# Patient Record
Sex: Female | Born: 1990 | Race: Black or African American | Hispanic: No | Marital: Single | Smoking: Former smoker
Health system: Southern US, Community
[De-identification: ages and names within clinical notes are randomized; demographics above are authoritative.]

## PROBLEM LIST (undated history)

## (undated) DIAGNOSIS — J40 Bronchitis, not specified as acute or chronic: Secondary | ICD-10-CM

## (undated) DIAGNOSIS — D649 Anemia, unspecified: Secondary | ICD-10-CM

## (undated) DIAGNOSIS — E119 Type 2 diabetes mellitus without complications: Secondary | ICD-10-CM

## (undated) HISTORY — PX: WISDOM TOOTH EXTRACTION: SHX21

---

## 2010-04-26 ENCOUNTER — Inpatient Hospital Stay (HOSPITAL_COMMUNITY): Admission: AD | Admit: 2010-04-26 | Discharge: 2010-04-26 | Payer: Self-pay | Admitting: Obstetrics & Gynecology

## 2010-04-29 ENCOUNTER — Inpatient Hospital Stay (HOSPITAL_COMMUNITY): Admission: AD | Admit: 2010-04-29 | Discharge: 2010-04-29 | Payer: Self-pay | Admitting: Obstetrics and Gynecology

## 2010-04-29 ENCOUNTER — Inpatient Hospital Stay (HOSPITAL_COMMUNITY): Admission: AD | Admit: 2010-04-29 | Discharge: 2010-04-30 | Payer: Self-pay | Admitting: Obstetrics and Gynecology

## 2010-04-30 ENCOUNTER — Inpatient Hospital Stay (HOSPITAL_COMMUNITY): Admission: AD | Admit: 2010-04-30 | Discharge: 2010-05-03 | Payer: Self-pay | Admitting: Obstetrics and Gynecology

## 2010-11-21 LAB — CBC
HCT: 28.9 % — ABNORMAL LOW (ref 36.0–46.0)
HCT: 34.3 % — ABNORMAL LOW (ref 36.0–46.0)
Hemoglobin: 11.9 g/dL — ABNORMAL LOW (ref 12.0–15.0)
Hemoglobin: 9.8 g/dL — ABNORMAL LOW (ref 12.0–15.0)
MCV: 86.3 fL (ref 78.0–100.0)
RBC: 3.35 MIL/uL — ABNORMAL LOW (ref 3.87–5.11)
WBC: 11.7 10*3/uL — ABNORMAL HIGH (ref 4.0–10.5)
WBC: 14.8 10*3/uL — ABNORMAL HIGH (ref 4.0–10.5)

## 2010-11-21 LAB — RH IMMUNE GLOB WKUP(>/=20WKS)(NOT WOMEN'S HOSP)

## 2011-03-31 ENCOUNTER — Emergency Department (HOSPITAL_BASED_OUTPATIENT_CLINIC_OR_DEPARTMENT_OTHER)
Admission: EM | Admit: 2011-03-31 | Discharge: 2011-03-31 | Disposition: A | Discharge status: Home / Self Care | Payer: Self-pay | Attending: Emergency Medicine | Admitting: Emergency Medicine

## 2011-03-31 ENCOUNTER — Emergency Department (INDEPENDENT_AMBULATORY_CARE_PROVIDER_SITE_OTHER): Payer: Self-pay

## 2011-03-31 ENCOUNTER — Encounter: Payer: Self-pay | Admitting: Emergency Medicine

## 2011-03-31 DIAGNOSIS — W2209XA Striking against other stationary object, initial encounter: Secondary | ICD-10-CM | POA: Insufficient documentation

## 2011-03-31 DIAGNOSIS — R609 Edema, unspecified: Secondary | ICD-10-CM

## 2011-03-31 DIAGNOSIS — S93409A Sprain of unspecified ligament of unspecified ankle, initial encounter: Secondary | ICD-10-CM | POA: Insufficient documentation

## 2011-03-31 DIAGNOSIS — M25579 Pain in unspecified ankle and joints of unspecified foot: Secondary | ICD-10-CM

## 2011-03-31 DIAGNOSIS — S93402A Sprain of unspecified ligament of left ankle, initial encounter: Secondary | ICD-10-CM

## 2011-03-31 MED ORDER — HYDROCODONE-ACETAMINOPHEN 5-500 MG PO TABS: 1.0000 | ORAL_TABLET | Freq: Four times a day (QID) | ORAL | Status: AC | PRN

## 2011-03-31 NOTE — ED Notes (Signed)
Pt c/o left ankle pain x 1 week after injuring ankle while wrestling with friend.

## 2011-03-31 NOTE — ED Provider Notes (Signed)
History     Chief Complaint  Patient presents with  . Ankle Pain   Patient is a 20 y.o. female presenting with ankle pain. The history is provided by the patient.  Ankle Pain  Incident onset: One week ago. The incident occurred at home. The injury mechanism was a direct blow (Hyperextension of left ankle). The pain is present in the left ankle. The quality of the pain is described as aching. The pain is moderate. The pain has been constant since onset. Pertinent negatives include no inability to bear weight, no muscle weakness and no loss of sensation. She has tried NSAIDs for the symptoms. The treatment provided mild relief.    History reviewed. No pertinent past medical history.  History reviewed. No pertinent past surgical history.  No family history on file.  History  Substance Use Topics  . Smoking status: Never Smoker   . Smokeless tobacco: Not on file  . Alcohol Use: No    OB History    Grav Para Term Preterm Abortions TAB SAB Ect Mult Living                  Review of Systems  All other systems reviewed and are negative.    Physical Exam  BP 130/68  Pulse 82  Temp(Src) 98 F (36.7 C) (Oral)  Resp 18  SpO2 100%  Physical Exam  Constitutional: She is oriented to person, place, and time. She appears well-developed and well-nourished.  HENT:  Head: Normocephalic.  Eyes: EOM are normal.  Neck: Normal range of motion.  Pulmonary/Chest: Effort normal.  Musculoskeletal:       Left ankle with tenderness at the medial and lateral malleolus. There is mild swelling to the bilateral malleolus. Left foot is neurovascularly intact. There is no bruising erythema or deformity noted. Normal perfusion of toes  Neurological: She is alert and oriented to person, place, and time.  Psychiatric: She has a normal mood and affect.    ED Course  Procedures  MDM Suspect left ankle sprain. Given tenderness Will x-ray to further evaluate for possible fracture. Patient does not  require pain medicine at this time  5:40 AM I personally evaluated the left ankle x-ray I don't see evidence of fracture or dislocation. Home with crutches and an ASO brace. Orthopedic followup     Lyanne Co, MD 03/31/11 [phone]

## 2011-07-03 ENCOUNTER — Emergency Department (HOSPITAL_BASED_OUTPATIENT_CLINIC_OR_DEPARTMENT_OTHER)
Admission: EM | Admit: 2011-07-03 | Discharge: 2011-07-03 | Disposition: A | Discharge status: Home / Self Care | Payer: Self-pay | Attending: Emergency Medicine | Admitting: Emergency Medicine

## 2011-07-03 ENCOUNTER — Encounter (HOSPITAL_BASED_OUTPATIENT_CLINIC_OR_DEPARTMENT_OTHER): Payer: Self-pay | Admitting: *Deleted

## 2011-07-03 ENCOUNTER — Emergency Department (INDEPENDENT_AMBULATORY_CARE_PROVIDER_SITE_OTHER): Payer: Self-pay

## 2011-07-03 DIAGNOSIS — R059 Cough, unspecified: Secondary | ICD-10-CM | POA: Insufficient documentation

## 2011-07-03 DIAGNOSIS — J3489 Other specified disorders of nose and nasal sinuses: Secondary | ICD-10-CM | POA: Insufficient documentation

## 2011-07-03 DIAGNOSIS — R05 Cough: Secondary | ICD-10-CM

## 2011-07-03 DIAGNOSIS — J4489 Other specified chronic obstructive pulmonary disease: Secondary | ICD-10-CM | POA: Insufficient documentation

## 2011-07-03 DIAGNOSIS — J40 Bronchitis, not specified as acute or chronic: Secondary | ICD-10-CM | POA: Insufficient documentation

## 2011-07-03 DIAGNOSIS — J449 Chronic obstructive pulmonary disease, unspecified: Secondary | ICD-10-CM | POA: Insufficient documentation

## 2011-07-03 DIAGNOSIS — J029 Acute pharyngitis, unspecified: Secondary | ICD-10-CM | POA: Insufficient documentation

## 2011-07-03 MED ORDER — ALBUTEROL SULFATE HFA 108 (90 BASE) MCG/ACT IN AERS: 1.0000 | INHALATION_SPRAY | Freq: Four times a day (QID) | RESPIRATORY_TRACT | Status: DC | PRN

## 2011-07-03 MED ORDER — AZITHROMYCIN 250 MG PO TABS: 250.0000 mg | ORAL_TABLET | Freq: Every day | ORAL | Status: AC

## 2011-07-03 MED ORDER — ALBUTEROL SULFATE (5 MG/ML) 0.5% IN NEBU
2.5000 mg | INHALATION_SOLUTION | Freq: Once | RESPIRATORY_TRACT | Status: AC
  Administered 2011-07-03: 2.5 mg via RESPIRATORY_TRACT
  Filled 2011-07-03: qty 0.5

## 2011-07-03 MED ORDER — HYDROCOD POLST-CHLORPHEN POLST 10-8 MG/5ML PO LQCR: 5.0000 mL | Freq: Two times a day (BID) | ORAL | Status: DC

## 2011-07-03 MED ORDER — IPRATROPIUM BROMIDE 0.02 % IN SOLN
0.5000 mg | Freq: Once | RESPIRATORY_TRACT | Status: AC
  Administered 2011-07-03: 0.5 mg via RESPIRATORY_TRACT
  Filled 2011-07-03: qty 2.5

## 2011-07-03 NOTE — ED Provider Notes (Signed)
Medical screening examination/treatment/procedure(s) were performed by non-physician practitioner and as supervising physician I was immediately available for consultation/collaboration.  Doug Sou, MD 07/03/11 [phone]

## 2011-07-03 NOTE — ED Notes (Signed)
2 day history of cough congestion scratchy throat states got much worse this morning with almost continual coughing and sore throat

## 2011-07-03 NOTE — ED Provider Notes (Signed)
History     CSN: [id number] Arrival date & time: 07/03/2011 11:38 AM   First MD Initiated Contact with Patient 07/03/11 1204      Chief Complaint  Patient presents with  . Cough  . Nasal Congestion  . Sore Throat    (Consider location/radiation/quality/duration/timing/severity/associated sxs/prior treatment) Patient is a 20 y.o. female presenting with cough and pharyngitis. The history is provided by the patient. No language interpreter was used.  Cough This is a new problem. The current episode started 2 days ago. The problem occurs constantly. The problem has been gradually worsening. The cough is productive of brown sputum. The maximum temperature recorded prior to her arrival was 100 to 100.9 F. The fever has been present for less than 1 day. Associated symptoms include chest pain. She has tried nothing for the symptoms. The treatment provided no relief. She is a smoker. Her past medical history is significant for COPD and asthma. Her past medical history does not include pneumonia.  Sore Throat Associated symptoms include chest pain and coughing.    History reviewed. No pertinent past medical history.  History reviewed. No pertinent past surgical history.  History reviewed. No pertinent family history.  History  Substance Use Topics  . Smoking status: Current Some Day Smoker  . Smokeless tobacco: Never Used  . Alcohol Use: Yes    OB History    Grav Para Term Preterm Abortions TAB SAB Ect Mult Living                  Review of Systems  Respiratory: Positive for cough.   Cardiovascular: Positive for chest pain.  All other systems reviewed and are negative.    Allergies  Flagyl and Latex  Home Medications  No current outpatient prescriptions on file.  BP 114/53  Pulse 104  Temp(Src) 99.6 F (37.6 C) (Oral)  Resp 18  Ht 5\' 6"  (1.676 m)  SpO2 99%  Physical Exam  Nursing note and vitals reviewed. Constitutional: She appears well-developed and  well-nourished.  HENT:  Head: Normocephalic.  Eyes: Conjunctivae and EOM are normal. Pupils are equal, round, and reactive to light.  Neck: Normal range of motion. Neck supple.  Cardiovascular: Normal rate.   Pulmonary/Chest: She exhibits tenderness.  Abdominal: Soft.  Neurological: She is alert.  Skin: Skin is warm.  Psychiatric: She has a normal mood and affect.    ED Course  Procedures (including critical care time)  Labs Reviewed - No data to display Dg Chest 2 View  07/03/2011  *RADIOLOGY REPORT*  Clinical Data: Cough.  CHEST - 2 VIEW  Comparison: None.  Findings: Lungs are clear.  Heart size is normal.  No pneumothorax or pleural effusion.  IMPRESSION: Negative chest.  Original Report Authenticated By: Bernadene Bell. Maricela Curet, M.D.     No diagnosis found.    MDM  Chest xray no pneumonia.  I will treat with albuterol inhaler,  z pack and tussionex.  Pt advised 3 day recheck with her MD        Brandy Garza, [place] 07/03/11 1305

## 2011-11-10 ENCOUNTER — Emergency Department (HOSPITAL_BASED_OUTPATIENT_CLINIC_OR_DEPARTMENT_OTHER)
Admission: EM | Admit: 2011-11-10 | Discharge: 2011-11-10 | Disposition: A | Discharge status: Home / Self Care | Payer: Medicaid Other | Attending: Emergency Medicine | Admitting: Emergency Medicine

## 2011-11-10 ENCOUNTER — Encounter (HOSPITAL_BASED_OUTPATIENT_CLINIC_OR_DEPARTMENT_OTHER): Payer: Self-pay | Admitting: *Deleted

## 2011-11-10 DIAGNOSIS — R059 Cough, unspecified: Secondary | ICD-10-CM | POA: Insufficient documentation

## 2011-11-10 DIAGNOSIS — R05 Cough: Secondary | ICD-10-CM | POA: Insufficient documentation

## 2011-11-10 DIAGNOSIS — Z87891 Personal history of nicotine dependence: Secondary | ICD-10-CM | POA: Insufficient documentation

## 2011-11-10 DIAGNOSIS — R112 Nausea with vomiting, unspecified: Secondary | ICD-10-CM | POA: Insufficient documentation

## 2011-11-10 DIAGNOSIS — J3489 Other specified disorders of nose and nasal sinuses: Secondary | ICD-10-CM | POA: Insufficient documentation

## 2011-11-10 DIAGNOSIS — R509 Fever, unspecified: Secondary | ICD-10-CM | POA: Insufficient documentation

## 2011-11-10 DIAGNOSIS — R1110 Vomiting, unspecified: Secondary | ICD-10-CM

## 2011-11-10 DIAGNOSIS — B349 Viral infection, unspecified: Secondary | ICD-10-CM

## 2011-11-10 DIAGNOSIS — R197 Diarrhea, unspecified: Secondary | ICD-10-CM | POA: Insufficient documentation

## 2011-11-10 DIAGNOSIS — B9789 Other viral agents as the cause of diseases classified elsewhere: Secondary | ICD-10-CM | POA: Insufficient documentation

## 2011-11-10 LAB — URINALYSIS, ROUTINE W REFLEX MICROSCOPIC
Glucose, UA: NEGATIVE mg/dL
Ketones, ur: 15 mg/dL — AB
Leukocytes, UA: NEGATIVE
Nitrite: NEGATIVE
Protein, ur: NEGATIVE mg/dL

## 2011-11-10 LAB — BASIC METABOLIC PANEL
BUN: 9 mg/dL (ref 6–23)
Chloride: 100 mEq/L (ref 96–112)
Creatinine, Ser: 0.8 mg/dL (ref 0.50–1.10)
GFR calc Af Amer: >90 mL/min (ref >90)

## 2011-11-10 MED ORDER — ONDANSETRON HCL 4 MG/2ML IJ SOLN
4.0000 mg | Freq: Once | INTRAMUSCULAR | Status: AC
  Administered 2011-11-10: 4 mg via INTRAVENOUS
  Filled 2011-11-10: qty 2

## 2011-11-10 MED ORDER — ONDANSETRON 4 MG PO TBDP: 4.0000 mg | ORAL_TABLET | Freq: Three times a day (TID) | ORAL | Status: AC | PRN

## 2011-11-10 MED ORDER — SODIUM CHLORIDE 0.9 % IV BOLUS (SEPSIS)
1000.0000 mL | Freq: Once | INTRAVENOUS | Status: AC
  Administered 2011-11-10: 1000 mL via INTRAVENOUS

## 2011-11-10 MED ORDER — HYDROCOD POLST-CHLORPHEN POLST 10-8 MG/5ML PO LQCR: 5.0000 mL | Freq: Two times a day (BID) | ORAL | Status: DC | PRN

## 2011-11-10 NOTE — Discharge Instructions (Signed)
Antibiotic Nonuse  Your caregiver felt that the infection or problem was not one that would be helped with an antibiotic. Infections may be caused by viruses or bacteria. Only a caregiver can tell which one of these is the likely cause of an illness. A cold is the most common cause of infection in both adults and children. A cold is a virus. Antibiotic treatment will have no effect on a viral infection. Viruses can lead to many lost days of work caring for sick children and many missed days of school. Children may catch as many as 10 "colds" or "flus" per year during which they can be tearful, cranky, and uncomfortable. The goal of treating a virus is aimed at keeping the ill person comfortable. Antibiotics are medications used to help the body fight bacterial infections. There are relatively few types of bacteria that cause infections but there are hundreds of viruses. While both viruses and bacteria cause infection they are very different types of germs. A viral infection will typically go away by itself within 7 to 10 days. Bacterial infections may spread or get worse without antibiotic treatment. Examples of bacterial infections are:  Sore throats (like strep throat or tonsillitis).   Infection in the lung (pneumonia).   Ear and skin infections.  Examples of viral infections are:  Colds or flus.   Most coughs and bronchitis.   Sore throats not caused by Strep.   Runny noses.  It is often best not to take an antibiotic when a viral infection is the cause of the problem. Antibiotics can kill off the helpful bacteria that we have inside our body and allow harmful bacteria to start growing. Antibiotics can cause side effects such as allergies, nausea, and diarrhea without helping to improve the symptoms of the viral infection. Additionally, repeated uses of antibiotics can cause bacteria inside of our body to become resistant. That resistance can be passed onto harmful bacterial. The next time  you have an infection it may be harder to treat if antibiotics are used when they are not needed. Not treating with antibiotics allows our own immune system to develop and take care of infections more efficiently. Also, antibiotics will work better for us when they are prescribed for bacterial infections. Treatments for a child that is ill may include:  Give extra fluids throughout the day to stay hydrated.   Get plenty of rest.   Only give your child over-the-counter or prescription medicines for pain, discomfort, or fever as directed by your caregiver.   The use of a cool mist humidifier may help stuffy noses.   Cold medications if suggested by your caregiver.  Your caregiver may decide to start you on an antibiotic if:  The problem you were seen for today continues for a longer length of time than expected.   You develop a secondary bacterial infection.  SEEK MEDICAL CARE IF:  Fever lasts longer than 5 days.   Symptoms continue to get worse after 5 to 7 days or become severe.   Difficulty in breathing develops.   Signs of dehydration develop (poor drinking, rare urinating, dark colored urine).   Changes in behavior or worsening tiredness (listlessness or lethargy).  Document Released: 11/02/2001 Document Revised: 08/13/2011 Document Reviewed: 05/01/2009 Exit[employer] Patient Information 2012 [employer]. 

## 2011-11-10 NOTE — ED Notes (Signed)
Pt c/o n/v/d x 3days.  

## 2011-11-10 NOTE — ED Provider Notes (Signed)
History     CSN: [id number]  Arrival date & time 11/10/11  1707   First MD Initiated Contact with Patient 11/10/11 1720      Chief Complaint  Patient presents with  . Fever  . Influenza    (Consider location/radiation/quality/duration/timing/severity/associated sxs/prior treatment) Patient is a 21 y.o. female presenting with fever. The history is provided by the patient. No language interpreter was used.  Fever Primary symptoms of the febrile illness include fever, cough, nausea, vomiting and diarrhea. Primary symptoms do not include shortness of breath. The current episode started 3 to 5 days ago. This is a new problem. The problem has not changed since onset.   History reviewed. No pertinent past medical history.  Past Surgical History  Procedure Date  . Cesarean section     History reviewed. No pertinent family history.  History  Substance Use Topics  . Smoking status: Former Games developer  . Smokeless tobacco: Never Used  . Alcohol Use: Yes    OB History    Grav Para Term Preterm Abortions TAB SAB Ect Mult Living                  Review of Systems  Constitutional: Positive for fever.  Respiratory: Positive for cough. Negative for shortness of breath.   Gastrointestinal: Positive for nausea, vomiting and diarrhea.  All other systems reviewed and are negative.    Allergies  Latex and Flagyl  Home Medications   Current Outpatient Rx  Name Route Sig Dispense Refill  . IBUPROFEN 200 MG PO TABS Oral Take 600 mg by mouth once as needed. For fever    . LEVONORGESTREL 20 MCG/24HR IU IUD Intrauterine 1 each by Intrauterine route once. Inserted 2011      BP 118/66  Pulse 98  Temp 99.9 F (37.7 C)  Resp 16  Ht 5\' 6"  (1.676 m)  Wt 170 lb (77.111 kg)  BMI 27.44 kg/m2  SpO2 98%  Physical Exam  Nursing note and vitals reviewed. Constitutional: She is oriented to person, place, and time. She appears well-developed and well-nourished.  HENT:  Head: Normocephalic  and atraumatic.  Right Ear: External ear normal.  Left Ear: External ear normal.  Nose: Rhinorrhea present.  Mouth/Throat: Oropharynx is clear and moist.  Eyes: Conjunctivae and EOM are normal.  Neck: Neck supple.  Cardiovascular: Normal rate and regular rhythm.   Pulmonary/Chest: Effort normal and breath sounds normal.  Abdominal: Soft. Bowel sounds are normal. There is no tenderness.  Musculoskeletal: Normal range of motion.  Neurological: She is alert and oriented to person, place, and time.  Skin: Skin is warm and dry.  Psychiatric: She has a normal mood and affect.    ED Course  Procedures (including critical care time)  Labs Reviewed  URINALYSIS, ROUTINE W REFLEX MICROSCOPIC - Abnormal; Notable for the following:    Ketones, ur 15 (*)    All other components within normal limits  BASIC METABOLIC PANEL - Abnormal; Notable for the following:    Potassium 3.4 (*)    All other components within normal limits  PREGNANCY, URINE   No results found.   1. Vomiting and diarrhea   2. Viral illness       MDM  Pt is tolerating po:pt is feeling better at this time        Brandy Lower, NP 11/10/11 1932

## 2011-11-10 NOTE — ED Notes (Signed)
Report received from [facility] . Assuming care of patient at this time.

## 2011-11-11 NOTE — ED Provider Notes (Signed)
Medical screening examination/treatment/procedure(s) were performed by non-physician practitioner and as supervising physician I was immediately available for consultation/collaboration.   Dahlia Nifong, MD 11/11/11 0821 

## 2012-05-26 ENCOUNTER — Emergency Department (HOSPITAL_BASED_OUTPATIENT_CLINIC_OR_DEPARTMENT_OTHER)
Admission: EM | Admit: 2012-05-26 | Discharge: 2012-05-26 | Disposition: A | Discharge status: Home / Self Care | Payer: Medicaid Other | Attending: Emergency Medicine | Admitting: Emergency Medicine

## 2012-05-26 ENCOUNTER — Emergency Department (HOSPITAL_BASED_OUTPATIENT_CLINIC_OR_DEPARTMENT_OTHER): Payer: Medicaid Other

## 2012-05-26 ENCOUNTER — Encounter (HOSPITAL_BASED_OUTPATIENT_CLINIC_OR_DEPARTMENT_OTHER): Payer: Self-pay

## 2012-05-26 DIAGNOSIS — J069 Acute upper respiratory infection, unspecified: Secondary | ICD-10-CM | POA: Insufficient documentation

## 2012-05-26 DIAGNOSIS — Z87891 Personal history of nicotine dependence: Secondary | ICD-10-CM | POA: Insufficient documentation

## 2012-05-26 HISTORY — DX: Bronchitis, not specified as acute or chronic: J40

## 2012-05-26 MED ORDER — HYDROCOD POLST-CHLORPHEN POLST 10-8 MG/5ML PO LQCR: 5.0000 mL | Freq: Two times a day (BID) | ORAL | Status: DC | PRN

## 2012-05-26 NOTE — ED Provider Notes (Signed)
History     CSN: [id number]  Arrival date & time 05/26/12  1141   First MD Initiated Contact with Patient 05/26/12 1202      Chief Complaint  Patient presents with  . URI    (Consider location/radiation/quality/duration/timing/severity/associated sxs/prior treatment) HPI  Patient with fever began Saturday, resolved Sunday with no other symptoms except began having scratchy throat.  Yesterday began having cough productive of green sputum with voice change.  Paient with soreness to chest increases with cough.  Mild dyspnea with cough, voice change but throat only slighlty sore.    Past Medical History  Diagnosis Date  . Bronchitis     Past Surgical History  Procedure Date  . Cesarean section     No family history on file.  History  Substance Use Topics  . Smoking status: Former Games developer  . Smokeless tobacco: Never Used  . Alcohol Use: Yes    OB History    Grav Para Term Preterm Abortions TAB SAB Ect Mult Living                  Review of Systems  Constitutional: Positive for fever and chills. Negative for activity change, appetite change and unexpected weight change.  HENT: Negative for sore throat, rhinorrhea, neck pain, neck stiffness and sinus pressure.   Eyes: Negative for visual disturbance.  Respiratory: Positive for cough and shortness of breath.   Cardiovascular: Negative for chest pain and leg swelling.  Gastrointestinal: Negative for vomiting, abdominal pain, diarrhea and blood in stool.  Genitourinary: Negative for dysuria, urgency, frequency, vaginal discharge and difficulty urinating.  Musculoskeletal: Negative for myalgias, arthralgias and gait problem.  Skin: Negative for color change and rash.  Neurological: Positive for headaches. Negative for weakness and light-headedness.  Hematological: Does not bruise/bleed easily.  Psychiatric/Behavioral: Negative for dysphoric mood.    Allergies  Latex and Flagyl  Home Medications   Current Outpatient  Rx  Name Route Sig Dispense Refill  . NAPROXEN SODIUM 220 MG PO TABS Oral Take 220 mg by mouth 2 (two) times daily with a meal.    . HYDROCOD POLST-CPM POLST ER 10-8 MG/5ML PO LQCR Oral Take 5 mLs by mouth every 12 (twelve) hours as needed. 140 mL 0  . IBUPROFEN 200 MG PO TABS Oral Take 600 mg by mouth once as needed. For fever    . LEVONORGESTREL 20 MCG/24HR IU IUD Intrauterine 1 each by Intrauterine route once. Inserted 2011      BP 121/81  Pulse 87  Temp 99.4 F (37.4 C) (Oral)  Resp 16  SpO2 99%  Physical Exam  Nursing note and vitals reviewed. Constitutional: She is oriented to person, place, and time. She appears well-developed and well-nourished.  HENT:  Head: Normocephalic and atraumatic.  Eyes: Conjunctivae normal and EOM are normal. Pupils are equal, round, and reactive to light.  Neck: Normal range of motion. Neck supple.  Cardiovascular: Normal rate, regular rhythm, normal heart sounds and intact distal pulses.   Pulmonary/Chest: Effort normal and breath sounds normal.  Abdominal: Soft. Bowel sounds are normal.  Musculoskeletal: Normal range of motion.  Neurological: She is alert and oriented to person, place, and time.  Skin: Skin is warm and dry.  Psychiatric: She has a normal mood and affect. Thought content normal.    ED Course  Procedures (including critical care time)  Labs Reviewed - No data to display No results found.   No diagnosis found.    MDM  CXR obtained due to history  of fever and productive cough.  No acute infiltrate per radiologist.  Plan conservative treatment with cough meds.      Hilario Quarry, MD 05/26/12 1309

## 2012-05-26 NOTE — ED Notes (Signed)
Fever, fatigue,prod cough-c/o started x 6 days ago

## 2017-10-28 ENCOUNTER — Other Ambulatory Visit: Payer: Self-pay

## 2017-10-28 ENCOUNTER — Emergency Department (HOSPITAL_BASED_OUTPATIENT_CLINIC_OR_DEPARTMENT_OTHER)
Admission: EM | Admit: 2017-10-28 | Discharge: 2017-10-28 | Disposition: A | Discharge status: Home / Self Care | Payer: Managed Care, Other (non HMO) | Attending: Emergency Medicine | Admitting: Emergency Medicine

## 2017-10-28 ENCOUNTER — Emergency Department (HOSPITAL_BASED_OUTPATIENT_CLINIC_OR_DEPARTMENT_OTHER): Payer: Managed Care, Other (non HMO)

## 2017-10-28 ENCOUNTER — Encounter (HOSPITAL_BASED_OUTPATIENT_CLINIC_OR_DEPARTMENT_OTHER): Payer: Self-pay | Admitting: Emergency Medicine

## 2017-10-28 DIAGNOSIS — Z79899 Other long term (current) drug therapy: Secondary | ICD-10-CM | POA: Diagnosis not present

## 2017-10-28 DIAGNOSIS — R0602 Shortness of breath: Secondary | ICD-10-CM

## 2017-10-28 DIAGNOSIS — Z91040 Latex allergy status: Secondary | ICD-10-CM | POA: Insufficient documentation

## 2017-10-28 DIAGNOSIS — Z87891 Personal history of nicotine dependence: Secondary | ICD-10-CM | POA: Insufficient documentation

## 2017-10-28 LAB — CBC
HEMATOCRIT: 37.7 % (ref 36.0–46.0)
HEMOGLOBIN: 12.2 g/dL (ref 12.0–15.0)
MCH: 26.6 pg (ref 26.0–34.0)
MCHC: 32.4 g/dL (ref 30.0–36.0)
MCV: 82.3 fL (ref 78.0–100.0)
Platelets: 302 10*3/uL (ref 150–400)
RBC: 4.58 MIL/uL (ref 3.87–5.11)
RDW: 13.4 % (ref 11.5–15.5)
WBC: 6.5 10*3/uL (ref 4.0–10.5)

## 2017-10-28 LAB — D-DIMER, QUANTITATIVE (NOT AT ARMC): D DIMER QUANT: 0.28 ug{FEU}/mL (ref 0.00–0.50)

## 2017-10-28 LAB — BASIC METABOLIC PANEL
Anion gap: 9 (ref 5–15)
BUN: 7 mg/dL (ref 6–20)
CALCIUM: 9.1 mg/dL (ref 8.9–10.3)
CHLORIDE: 102 mmol/L (ref 101–111)
CO2: 26 mmol/L (ref 22–32)
CREATININE: 0.58 mg/dL (ref 0.44–1.00)
GFR calc non Af Amer: >60 mL/min (ref >60)
Glucose, Bld: 84 mg/dL (ref 65–99)
Potassium: 3.3 mmol/L — ABNORMAL LOW (ref 3.5–5.1)
SODIUM: 137 mmol/L (ref 135–145)

## 2017-10-28 LAB — PREGNANCY, URINE: PREG TEST UR: NEGATIVE

## 2017-10-28 LAB — TROPONIN I: Troponin I: <0.03 ng/mL (ref <0.03)

## 2017-10-28 NOTE — ED Provider Notes (Signed)
[facility] EMERGENCY DEPARTMENT Provider Note   CSN: [id number] Arrival date & time: 10/28/17  1507     History   Chief Complaint Chief Complaint  Patient presents with  . Shortness of Breath    HPI Brandy Garza is a 27 y.o. female.  Patient presents to the emergency department today with complaint of shortness of breath described as not feeling ache she can get enough air.  She states that it feels like she has to yawn in order to fill her lungs.  Earlier today she started having pain in her middle back which then turned into a substernal light pressure kind of sensation.  Patient did not have any lightheadedness or syncope.  No fevers or cough.  She did produce some yellow mucus last night without blood.  No nausea, vomiting, or diarrhea.  She does not have a history of asthma denies any wheezing.  No treatments prior to arrival other than fanning herself. Patient denies risk factors for pulmonary embolism including: unilateral leg swelling, history of DVT/PE/other blood clots, use of exogenous hormones, recent immobilizations, recent surgery, recent travel (>4hr segment), malignancy, hemoptysis.  Patient does report that her father was recently diagnosed with an unprovoked pulmonary embolism and is currently on anticoagulation and is seeing a hematologist for hypercoagulability testing. The onset of this condition was acute. The course is constant. Aggravating factors: none. Alleviating factors: none.   Patient does report nausea and vomiting in the mornings over the past several weeks.       Past Medical History:  Diagnosis Date  . Bronchitis     There are no active problems to display for this patient.   Past Surgical History:  Procedure Laterality Date  . CESAREAN SECTION      OB History    No data available       Home Medications    Prior to Admission medications   Medication Sig Start Date End Date Taking? Authorizing Provider    chlorpheniramine-HYDROcodone (TUSSIONEX PENNKINETIC ER) 10-8 MG/5ML LQCR Take 5 mLs by mouth every 12 (twelve) hours as needed. 11/10/11   Teressa LowerPickering, Vrinda, NP  chlorpheniramine-HYDROcodone (TUSSIONEX PENNKINETIC ER) 10-8 MG/5ML LQCR Take 5 mLs by mouth every 12 (twelve) hours as needed. 05/26/12   Margarita Grizzleay, Danielle, MD  ibuprofen (ADVIL,MOTRIN) 200 MG tablet Take 600 mg by mouth once as needed. For fever    [provider]  levonorgestrel (MIRENA) 20 MCG/24HR IUD 1 each by Intrauterine route once. Inserted 2011    [provider]  naproxen sodium (ANAPROX) 220 MG tablet Take 220 mg by mouth 2 (two) times daily with a meal.    [provider]    Family History History reviewed. No pertinent family history.  Social History Social History   Tobacco Use  . Smoking status: Former Games developermoker  . Smokeless tobacco: Never Used  Substance Use Topics  . Alcohol use: Yes  . Drug use: No     Allergies   Latex and Flagyl [metronidazole hcl]   Review of Systems Review of Systems  Constitutional: Negative for diaphoresis and fever.  Eyes: Negative for redness.  Respiratory: Positive for shortness of breath. Negative for cough.   Cardiovascular: Positive for chest pain. Negative for palpitations and leg swelling.  Gastrointestinal: Positive for nausea (ongoing) and vomiting (ongoing). Negative for abdominal pain.  Genitourinary: Negative for dysuria.  Musculoskeletal: Positive for back pain. Negative for neck pain.  Skin: Negative for rash.  Neurological: Negative for syncope and light-headedness.  Psychiatric/Behavioral: The  patient is not nervous/anxious.      Physical Exam Updated Vital Signs BP 139/87 (BP Location: Left Arm)   Pulse 82   Temp 99.2 F (37.3 C) (Oral)   Resp 19   Ht 5\' 7"  (1.702 m)   Wt 97.5 kg (215 lb)   SpO2 99%   BMI 33.67 kg/m   Physical Exam  Constitutional: She appears well-developed and well-nourished.  HENT:  Head: Normocephalic  and atraumatic.  Eyes: Conjunctivae are normal. Right eye exhibits no discharge. Left eye exhibits no discharge.  Neck: Normal range of motion. Neck supple.  Cardiovascular: Normal rate, regular rhythm and normal heart sounds.  Pulmonary/Chest: Effort normal and breath sounds normal. She has no decreased breath sounds. She has no wheezes. She has no rales.  Abdominal: Soft. There is no tenderness.  Musculoskeletal:       Right lower leg: She exhibits no tenderness and no edema.       Left lower leg: She exhibits no tenderness and no edema.  No clinical signs of DVT.  Neurological: She is alert.  Skin: Skin is warm and dry.  Psychiatric: She has a normal mood and affect.  Nursing note and vitals reviewed.    ED Treatments / Results  Labs (all labs ordered are listed, but only abnormal results are displayed) Labs Reviewed  BASIC METABOLIC PANEL - Abnormal; Notable for the following components:      Result Value   Potassium 3.3 (*)    All other components within normal limits  CBC  D-DIMER, QUANTITATIVE (NOT AT [facility])  PREGNANCY, URINE  TROPONIN I    EKG  EKG Interpretation  Date/Time:  Thursday October 28 2017 16:46:05 EST Ventricular Rate:  85 PR Interval:    QRS Duration: 84 QT Interval:  338 QTC Calculation: 402 R Axis:     Text Interpretation:  Sinus rhythm Nonspecific repol abnormality, inferior leads Borderline ST elevation, lateral leads No old tracing to compare Confirmed by Rolan Bucco [phone]) on 10/28/2017 4:53:48 PM       Radiology Dg Chest 2 View  Result Date: 10/28/2017 CLINICAL DATA:  Trouble getting enough air. Thoracic pain and epigastric pain. EXAM: CHEST  2 VIEW COMPARISON:  05/26/2012 FINDINGS: The heart size and mediastinal contours are within normal limits. Both lungs are clear. The visualized skeletal structures are unremarkable. IMPRESSION: No active cardiopulmonary disease. Electronically Signed   By: Elberta Fortis M.D.   On: 10/28/2017 15:38      Procedures Procedures (including critical care time)  Medications Ordered in ED Medications - No data to display   Initial Impression / Assessment and Plan / ED Course  I have reviewed the triage vital signs and the nursing notes.  Pertinent labs & imaging results that were available during my care of the patient were reviewed by me and considered in my medical decision making (see chart for details).     Patient seen and examined. Work-up initiated. EKG reviewed with Dr. Fredderick Phenix.  Patient is PERC negative however given family history of blood clot, unprovoked, will screen with d-dimer.  Patient agrees.  Vital signs reviewed and are as follows: BP 139/87 (BP Location: Left Arm)   Pulse 82   Temp 99.2 F (37.3 C) (Oral)   Resp 19   Ht 5\' 7"  (1.702 m)   Wt 97.5 kg (215 lb)   SpO2 99%   BMI 33.67 kg/m   5:21 PM Pt low-risk Wells' Criteria (0). D-dimer negative.   6:31 PM remainder of  workup reassuring.  Troponin is negative.  Patient is now back to her baseline without any shortness of breath or chest pressure.  She is comfortable with discharged home.  She is instructed to return if their chest pain gets worse and does not go away with rest, they have an attack of chest pain lasting longer than usual despite rest and treatment with the medications their caregiver has prescribed, if they wake from sleep with chest pain or shortness of breath, if they feel dizzy or faint, if they have chest pain not typical of their usual pain, or if they have any other emergent concerns regarding their health.  The patient verbalized understanding and agreed.    Final Clinical Impressions(s) / ED Diagnoses   Final diagnoses:  SOB (shortness of breath)   Patient with vague shortness of breath symptoms and some mild chest pressure today.  EKG with nonspecific ST changes.  Troponin is negative, d-dimer is negative.  Patient is back at her baseline with normal vital signs, no hypoxia or  tachycardia.  She will monitor symptoms closely at home and return with any worsening.  ED Discharge Orders    None       Renne Crigler, PA-C 10/28/17 1834    Rolan Bucco, MD 10/28/17 [phone]

## 2017-10-28 NOTE — ED Triage Notes (Signed)
Patient states that she woke up last night in the middle of the with feeling like she was not getting enough air, the patient states that "I make myself yawn to get enough air" - Patient then reports that she is having mid thoracic back pain that radiates to her epigastric region

## 2017-10-28 NOTE — Discharge Instructions (Signed)
Please read and follow all provided instructions.  Your diagnoses today include:  1. SOB (shortness of breath)     Tests performed today include:  EKG  Chest x-ray-no pneumonia or other problems  Blood test for heart irritation -negative  Screening test for blood clot -negative  Urine test -no pregnancy  Vital signs. See below for your results today.   Medications prescribed:   None  Take any prescribed medications only as directed.  Home care instructions:  Follow any educational materials contained in this packet.  BE VERY CAREFUL not to take multiple medicines containing Tylenol (also called acetaminophen). Doing so can lead to an overdose which can damage your liver and cause liver failure and possibly death.   Follow-up instructions: Please follow-up with your primary care provider in the next 3 days for further evaluation of your symptoms.   Return instructions:   Please return to the Emergency Department if you experience worsening symptoms.   Return with worsening chest pain, shortness of breath, passing out, vomiting.  Please return if you have any other emergent concerns.  Additional Information:  Your vital signs today were: BP 106/67    Pulse 88    Temp 99.2 F (37.3 C) (Oral)    Resp 14    Ht 5\' 7"  (1.702 m)    Wt 97.5 kg (215 lb)    SpO2 100%    BMI 33.67 kg/m  If your blood pressure (BP) was elevated above 135/85 this visit, please have this repeated by your doctor within one month. --------------

## 2018-04-30 ENCOUNTER — Other Ambulatory Visit: Payer: Self-pay

## 2018-04-30 ENCOUNTER — Emergency Department (HOSPITAL_BASED_OUTPATIENT_CLINIC_OR_DEPARTMENT_OTHER): Payer: Managed Care, Other (non HMO)

## 2018-04-30 ENCOUNTER — Encounter (HOSPITAL_BASED_OUTPATIENT_CLINIC_OR_DEPARTMENT_OTHER): Payer: Self-pay | Admitting: Adult Health

## 2018-04-30 ENCOUNTER — Emergency Department (HOSPITAL_BASED_OUTPATIENT_CLINIC_OR_DEPARTMENT_OTHER)
Admission: EM | Admit: 2018-04-30 | Discharge: 2018-04-30 | Disposition: A | Discharge status: Home / Self Care | Payer: Managed Care, Other (non HMO) | Attending: Emergency Medicine | Admitting: Emergency Medicine

## 2018-04-30 DIAGNOSIS — Z79899 Other long term (current) drug therapy: Secondary | ICD-10-CM | POA: Diagnosis not present

## 2018-04-30 DIAGNOSIS — Y999 Unspecified external cause status: Secondary | ICD-10-CM | POA: Insufficient documentation

## 2018-04-30 DIAGNOSIS — X58XXXA Exposure to other specified factors, initial encounter: Secondary | ICD-10-CM | POA: Insufficient documentation

## 2018-04-30 DIAGNOSIS — Z91040 Latex allergy status: Secondary | ICD-10-CM | POA: Insufficient documentation

## 2018-04-30 DIAGNOSIS — Y939 Activity, unspecified: Secondary | ICD-10-CM | POA: Diagnosis not present

## 2018-04-30 DIAGNOSIS — S299XXA Unspecified injury of thorax, initial encounter: Secondary | ICD-10-CM | POA: Diagnosis present

## 2018-04-30 DIAGNOSIS — S29012A Strain of muscle and tendon of back wall of thorax, initial encounter: Secondary | ICD-10-CM

## 2018-04-30 DIAGNOSIS — Z87891 Personal history of nicotine dependence: Secondary | ICD-10-CM | POA: Diagnosis not present

## 2018-04-30 DIAGNOSIS — Y929 Unspecified place or not applicable: Secondary | ICD-10-CM | POA: Diagnosis not present

## 2018-04-30 MED ORDER — KETOROLAC TROMETHAMINE 60 MG/2ML IM SOLN
30.0000 mg | Freq: Once | INTRAMUSCULAR | Status: AC
  Administered 2018-04-30: 30 mg via INTRAMUSCULAR
  Filled 2018-04-30: qty 2

## 2018-04-30 MED ORDER — CYCLOBENZAPRINE HCL 10 MG PO TABS: 10.0000 mg | ORAL_TABLET | Freq: Three times a day (TID) | ORAL | 0 refills | Status: DC | PRN

## 2018-04-30 NOTE — Discharge Instructions (Addendum)
Do not drive or operate heavy machinery or drink alcohol while on the muscle relaxer Flexeril.  You may take ibuprofen and/or Tylenol for pain as well.  If you develop worsening pain, trouble breathing, fever, weakness or numbness in your arms or legs, incontinence of your bowels or bladder, or any other new/concerning symptoms or return to the ER for evaluation.

## 2018-04-30 NOTE — ED Provider Notes (Signed)
[facility] EMERGENCY DEPARTMENT Provider Note   CSN: [id number] Arrival date & time: 04/30/18  1112     History   Chief Complaint Chief Complaint  Patient presents with  . Neck Pain    HPI Brandy Garza is a 27 y.o. female.   27 year old female presents with right-sided back pain.  2 weeks ago she had similar left-sided back pain the seem to go away after NSAIDs for a couple days.  No trauma noted.  Pain started at her bra line on the back under her scapula.  Any type of movement to the right worsens her pain.  It is somewhat pleuritic but there is no shortness of breath.  There is no cough, chest pain, abdominal pain.  No weakness or numbness.  Moving her right arm worsens the pain.  She took some ibuprofen yesterday but nothing today.  Past Medical History:  Diagnosis Date  . Bronchitis     There are no active problems to display for this patient.   Past Surgical History:  Procedure Laterality Date  . CESAREAN SECTION       OB History   None      Home Medications    Prior to Admission medications   Medication Sig Start Date End Date Taking? Authorizing Provider  chlorpheniramine-HYDROcodone (TUSSIONEX PENNKINETIC ER) 10-8 MG/5ML LQCR Take 5 mLs by mouth every 12 (twelve) hours as needed. 11/10/11   [name], NP  chlorpheniramine-HYDROcodone (TUSSIONEX PENNKINETIC ER) 10-8 MG/5ML LQCR Take 5 mLs by mouth every 12 (twelve) hours as needed. 05/26/12   Margarita Grizzle, MD  cyclobenzaprine (FLEXERIL) 10 MG tablet Take 1 tablet (10 mg total) by mouth 3 (three) times daily as needed for muscle spasms. 04/30/18   Pricilla Loveless, MD  ibuprofen (ADVIL,MOTRIN) 200 MG tablet Take 600 mg by mouth once as needed. For fever    [provider]  levonorgestrel (MIRENA) 20 MCG/24HR IUD 1 each by Intrauterine route once. Inserted 2011    [provider]  naproxen sodium (ANAPROX) 220 MG tablet Take 220 mg by mouth 2 (two) times daily with a meal.     [provider]    Family History History reviewed. No pertinent family history.  Social History Social History   Tobacco Use  . Smoking status: Former Games developer  . Smokeless tobacco: Never Used  Substance Use Topics  . Alcohol use: Yes  . Drug use: No     Allergies   Latex and Flagyl [metronidazole hcl]   Review of Systems Review of Systems  Constitutional: Negative for fever.  Respiratory: Negative for cough and shortness of breath.   Cardiovascular: Negative for chest pain.  Gastrointestinal: Negative for abdominal pain and vomiting.  Genitourinary:       No incontinence  Musculoskeletal: Positive for back pain.  Neurological: Negative for weakness and numbness.  All other systems reviewed and are negative.    Physical Exam Updated Vital Signs BP 137/73 (BP Location: Right Arm)   Pulse 87   Temp 98.4 F (36.9 C) (Oral)   Resp 20   Ht 5\' 7"  (1.702 m)   Wt 112.8 kg   LMP 03/21/2018 (Approximate)   SpO2 100%   BMI 38.95 kg/m   Physical Exam  Constitutional: She is oriented to person, place, and time. She appears well-developed and well-nourished.  obese  HENT:  Head: Normocephalic and atraumatic.  Right Ear: External ear normal.  Left Ear: External ear normal.  Nose: Nose normal.  Eyes: Right eye exhibits  no discharge. Left eye exhibits no discharge.  Cardiovascular: Normal rate, regular rhythm and normal heart sounds.  Pulses:      Radial pulses are 2+ on the right side.  Pulmonary/Chest: Effort normal and breath sounds normal.  Abdominal: Soft. There is no tenderness. There is no CVA tenderness.  Musculoskeletal:       Thoracic back: She exhibits tenderness and bony tenderness.       Back:  Neurological: She is alert and oriented to person, place, and time.  5/5 strength in all 4 extremities. Grossly normal sensation  Skin: Skin is warm and dry.  Nursing note and vitals reviewed.    ED Treatments / Results  Labs (all labs ordered  are listed, but only abnormal results are displayed) Labs Reviewed - No data to display  EKG None  Radiology Dg Chest 2 View  Result Date: 04/30/2018 CLINICAL DATA:  Right back pain in the upper chest. EXAM: CHEST - 2 VIEW COMPARISON:  10/28/2017 FINDINGS: Questionable rib irregularity along the right posterior fifth and sixth rib region, not appreciable on 10/28/2017. The lungs appear clear. Cardiac and mediastinal margins appear normal. No pleural effusion identified. IMPRESSION: 1. Questionable right posterolateral fifth and sixth rib irregularity. Healing rib fractures are not excluded although no displaced rib fracture is identified. This could be further assessed with either dedicated rib views or CT chest if clinically warranted. Electronically Signed   By: Gaylyn RongWalter  Liebkemann M.D.   On: 04/30/2018 12:56    Procedures Procedures (including critical care time)  Medications Ordered in ED Medications  ketorolac (TORADOL) injection 30 mg (30 mg Intramuscular Given 04/30/18 1217)     Initial Impression / Assessment and Plan / ED Course  I have reviewed the triage vital signs and the nursing notes.  Pertinent labs & imaging results that were available during my care of the patient were reviewed by me and considered in my medical decision making (see chart for details).     Patient's pain is almost undoubtedly muscular pain of her thoracic back.  Chest x-ray obtained given the location.  No obvious pneumothorax or other acute lung abnormality.  Possible abnormalities to the posterior lateral ribs.  While this would correlate with her pain, there is been no trauma.  This also appears to be an old injury, and her symptoms started yesterday.  Given all this, I have a low suspicion for an acute rib injury.  I discussed findings with patient and given it would not change management I do not think extra x-rays or CT would be beneficial.  She has had great relief with Toradol and I will have her  continue ibuprofen and prescribed a muscle relaxer.  Discussed return precautions.  As far as other acute intra-thoracic abnormalities, I have very low suspicion for ACS, PE, dissection, or intra-abdominal problems.  Final Clinical Impressions(s) / ED Diagnoses   Final diagnoses:  Strain of thoracic back region    ED Discharge Orders         Ordered    cyclobenzaprine (FLEXERIL) 10 MG tablet  3 times daily PRN     04/30/18 1325           Pricilla LovelessGoldston, Kambrie Eddleman, MD 04/30/18 1450

## 2018-04-30 NOTE — ED Triage Notes (Signed)
Presents with right sided neck pain and right sided shoulder blade pain that began yesterday and at first only hurt with movement, today the pain is worse and hurts all the time. SHe states it hurt her to take a deep breath and she is unable to lift her right arm due to pain.

## 2019-04-09 IMAGING — CR DG CHEST 2V
2 series · 2 of 2 positions shown · non-contrast
Comparison: 05/26/2012

CLINICAL DATA: Trouble getting enough air. Thoracic pain and
epigastric pain.

EXAM:
CHEST  2 VIEW

[w chest pa]
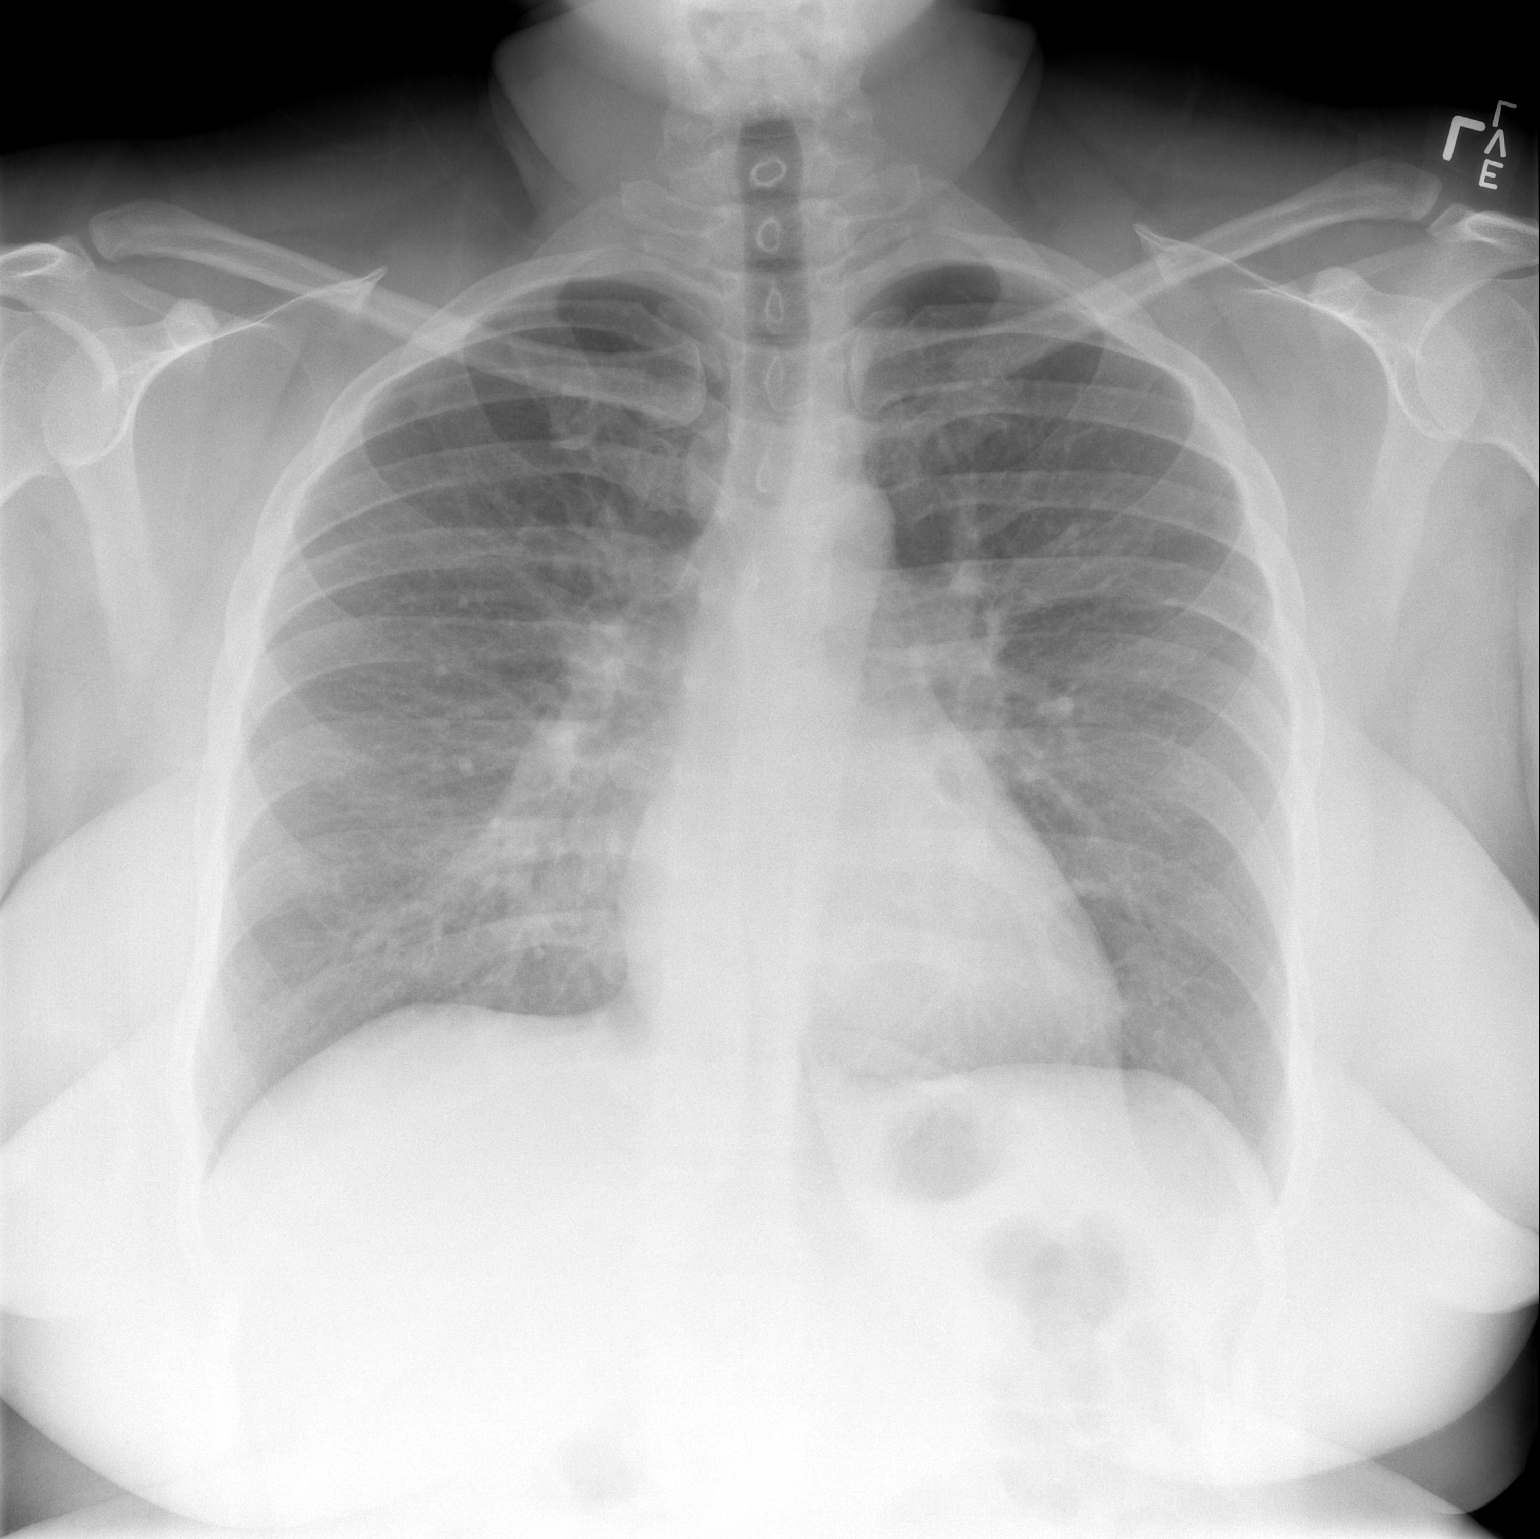

[w chest lat]
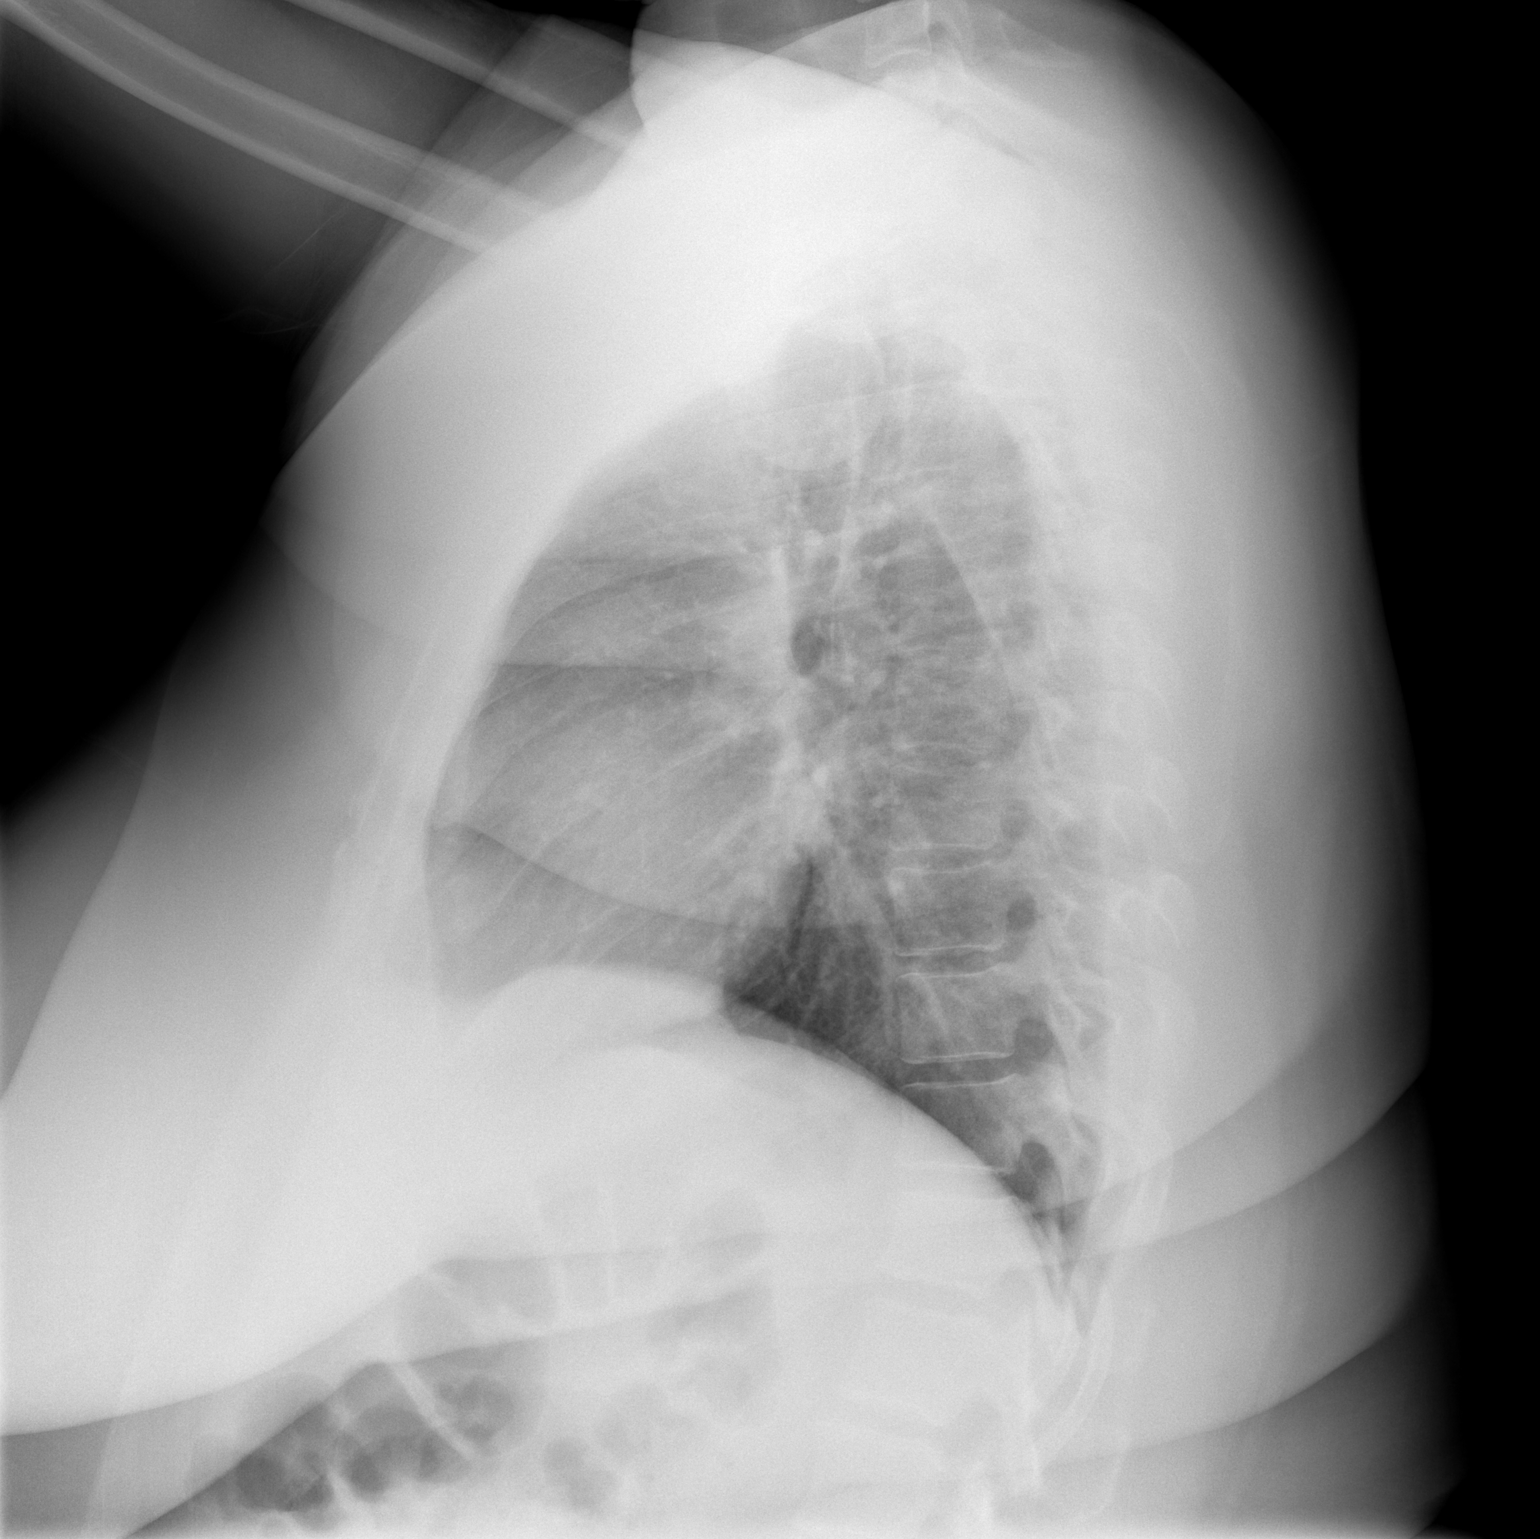

[2 of 2 positions shown; findings below may reference images not displayed]

FINDINGS: The heart size and mediastinal contours are within normal limits.
Both lungs are clear. The visualized skeletal structures are
unremarkable.
IMPRESSION: No active cardiopulmonary disease.

## 2020-12-06 LAB — RESULTS CONSOLE HPV: CHL HPV: NEGATIVE

## 2020-12-06 LAB — HM PAP SMEAR

## 2022-10-07 ENCOUNTER — Encounter: Payer: Self-pay | Admitting: Family Medicine

## 2022-10-07 ENCOUNTER — Telehealth: Payer: Self-pay | Admitting: Family Medicine

## 2022-10-07 ENCOUNTER — Ambulatory Visit (INDEPENDENT_AMBULATORY_CARE_PROVIDER_SITE_OTHER): Payer: Medicaid Other | Admitting: Family Medicine

## 2022-10-07 ENCOUNTER — Other Ambulatory Visit: Payer: Self-pay

## 2022-10-07 VITALS — BP 102/80 | HR 92 | Temp 98.4°F | Resp 16 | Ht 66.0 in | Wt 242.0 lb

## 2022-10-07 DIAGNOSIS — E1165 Type 2 diabetes mellitus with hyperglycemia: Secondary | ICD-10-CM

## 2022-10-07 LAB — LIPID PANEL
Cholesterol: 201 mg/dL — ABNORMAL HIGH (ref 0–200)
HDL: 44.2 mg/dL (ref >39.00)
LDL Cholesterol: 140 mg/dL — ABNORMAL HIGH (ref 0–99)
NonHDL: 157.27
Total CHOL/HDL Ratio: 5
Triglycerides: 88 mg/dL (ref 0.0–149.0)
VLDL: 17.6 mg/dL (ref 0.0–40.0)

## 2022-10-07 LAB — CBC
HCT: 40.5 % (ref 36.0–46.0)
Hemoglobin: 13.2 g/dL (ref 12.0–15.0)
MCHC: 32.6 g/dL (ref 30.0–36.0)
MCV: 80.2 fl (ref 78.0–100.0)
Platelets: 332 10*3/uL (ref 150.0–400.0)
RBC: 5.05 Mil/uL (ref 3.87–5.11)
RDW: 13.5 % (ref 11.5–15.5)
WBC: 4.8 10*3/uL (ref 4.0–10.5)

## 2022-10-07 LAB — COMPREHENSIVE METABOLIC PANEL
ALT: 13 U/L (ref 0–35)
AST: 12 U/L (ref 0–37)
Albumin: 4.1 g/dL (ref 3.5–5.2)
Alkaline Phosphatase: 76 U/L (ref 39–117)
BUN: 10 mg/dL (ref 6–23)
CO2: 28 mEq/L (ref 19–32)
Calcium: 9.4 mg/dL (ref 8.4–10.5)
Chloride: 99 mEq/L (ref 96–112)
Creatinine, Ser: 0.66 mg/dL (ref 0.40–1.20)
GFR: 115.27 mL/min (ref >60.00)
Glucose, Bld: 282 mg/dL — ABNORMAL HIGH (ref 70–99)
Potassium: 4.2 mEq/L (ref 3.5–5.1)
Sodium: 134 mEq/L — ABNORMAL LOW (ref 135–145)
Total Bilirubin: 0.3 mg/dL (ref 0.2–1.2)
Total Protein: 6.6 g/dL (ref 6.0–8.3)

## 2022-10-07 LAB — URINALYSIS, ROUTINE W REFLEX MICROSCOPIC
Bilirubin Urine: NEGATIVE
Hgb urine dipstick: NEGATIVE
Ketones, ur: 15 — AB
Leukocytes,Ua: NEGATIVE
Nitrite: NEGATIVE
RBC / HPF: NONE SEEN (ref 0–?)
Specific Gravity, Urine: >=1.03 — AB (ref 1.000–1.030)
Total Protein, Urine: NEGATIVE
Urine Glucose: >=1000 — AB
Urobilinogen, UA: 0.2 (ref 0.0–1.0)
pH: 6 (ref 5.0–8.0)

## 2022-10-07 LAB — HEMOGLOBIN A1C: Hgb A1c MFr Bld: 16.7 % — ABNORMAL HIGH (ref 4.6–6.5)

## 2022-10-07 LAB — MICROALBUMIN / CREATININE URINE RATIO
Creatinine,U: 129.1 mg/dL
Microalb Creat Ratio: 0.5 mg/g (ref 0.0–30.0)
Microalb, Ur: <0.7 mg/dL (ref 0.0–1.9)

## 2022-10-07 MED ORDER — ACCU-CHEK GUIDE VI STRP: 1.0000 | ORAL_STRIP | 3 refills | Status: DC

## 2022-10-07 MED ORDER — LANTUS SOLOSTAR 100 UNIT/ML ~~LOC~~ SOPN: PEN_INJECTOR | SUBCUTANEOUS | 11 refills | Status: DC

## 2022-10-07 MED ORDER — ACCU-CHEK FASTCLIX LANCETS MISC: 1.0000 | 3 refills | Status: DC

## 2022-10-07 MED ORDER — ACCU-CHEK GUIDE ME W/DEVICE KIT: 1.0000 | PACK | 0 refills | Status: DC

## 2022-10-07 NOTE — Telephone Encounter (Signed)
Pt called and stated that you prescribe the pt with glucose test strips but the pt said she do not have a machine to test it. Please give the pt a call

## 2022-10-07 NOTE — Telephone Encounter (Signed)
Pt said no test strips either. Called again 1:55

## 2022-10-07 NOTE — Telephone Encounter (Signed)
Returned patients call x 2 both times phone hung up. Will call back later.

## 2022-10-07 NOTE — Telephone Encounter (Signed)
Glucose machine, strips and lancets sent to pharmacy. Patient aware

## 2022-10-07 NOTE — Progress Notes (Signed)
Established Patient Office Visit   Subjective:  Patient ID: Brandy Garza, female    DOB: [birth year]  Age: 32 y.o. MRN: [id number]  Chief Complaint  Patient presents with   New Patient (Initial Visit)    Pt went to an urgent care and was told her glucose was elevated.  She was referred her to have her glucose checked.     HPI Encounter Diagnoses  Name Primary?   Type 2 diabetes mellitus with hyperglycemia, without long-term current use of insulin (HCC) Yes   Recent onset of thirst weight loss and increase in urinary frequency.  Was seen at urgent care glucose was elevated and hemoglobin A1c was measured at 15.  She was started on metformin 500 mg twice daily.  She has a 38-month-old at home as well as an older child.  There was gestational diet Beatties with the older child.  She was taking once a day insulin pregnancy.  Her near vision has been affected..  Father has died controlled diabetes.  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   Review of Systems  Constitutional:  Positive for weight loss.  HENT: Negative.    Eyes:  Positive for blurred vision. Negative for discharge and redness.  Respiratory: Negative.    Cardiovascular: Negative.   Gastrointestinal:  Negative for abdominal pain.  Genitourinary:  Positive for frequency.  Musculoskeletal: Negative.  Negative for myalgias.  Skin:  Negative for rash.  Neurological:  Negative for tingling, loss of consciousness and weakness.  Endo/Heme/Allergies:  Negative for polydipsia.     Current Outpatient Medications:    insulin glargine (LANTUS SOLOSTAR) 100 UNIT/ML Solostar Pen, Start with 20 U s.q. nightly., Disp: 15 mL, Rfl: 11   levonorgestrel (MIRENA) 20 MCG/24HR IUD, 1 each by Intrauterine route once. Inserted 2011, Disp: , Rfl:    metFORMIN (GLUCOPHAGE) 500 MG tablet, Take 500 mg by mouth 2 (two) times daily., Disp: , Rfl:    naproxen sodium (ANAPROX) 220 MG tablet, Take 220 mg by mouth 2 (two) times daily with  a meal., Disp: , Rfl:    chlorpheniramine-HYDROcodone (TUSSIONEX PENNKINETIC ER) 10-8 MG/5ML LQCR, Take 5 mLs by mouth every 12 (twelve) hours as needed., Disp: 140 mL, Rfl: 0   chlorpheniramine-HYDROcodone (TUSSIONEX PENNKINETIC ER) 10-8 MG/5ML LQCR, Take 5 mLs by mouth every 12 (twelve) hours as needed. (Patient not taking: Reported on 10/07/2022), Disp: 140 mL, Rfl: 0   cyclobenzaprine (FLEXERIL) 10 MG tablet, Take 1 tablet (10 mg total) by mouth 3 (three) times daily as needed for muscle spasms. (Patient not taking: Reported on 10/07/2022), Disp: 15 tablet, Rfl: 0   ibuprofen (ADVIL,MOTRIN) 200 MG tablet, Take 600 mg by mouth once as needed. For fever (Patient not taking: Reported on 10/07/2022), Disp: , Rfl:    Objective:     BP 102/80 (BP Location: Left Arm, Patient Position: Sitting, Cuff Size: Large)   Pulse 92   Temp 98.4 F (36.9 C) (Oral)   Resp 16   Ht 5\' 6"  (1.676 m)   Wt 242 lb (109.8 kg)   SpO2 96%   BMI 39.06 kg/m    Physical Exam Constitutional:      General: She is not in acute distress.    Appearance: Normal appearance. She is not ill-appearing, toxic-appearing or diaphoretic.  HENT:     Head: Normocephalic and atraumatic.     Right Ear: External ear normal.     Left Ear: External ear normal.     Mouth/Throat:  Mouth: Mucous membranes are moist.     Pharynx: Oropharynx is clear. No oropharyngeal exudate or posterior oropharyngeal erythema.  Eyes:     General: No scleral icterus.       Right eye: No discharge.        Left eye: No discharge.     Extraocular Movements: Extraocular movements intact.     Conjunctiva/sclera: Conjunctivae normal.     Pupils: Pupils are equal, round, and reactive to light.  Cardiovascular:     Rate and Rhythm: Normal rate and regular rhythm.  Pulmonary:     Effort: Pulmonary effort is normal. No respiratory distress.     Breath sounds: Normal breath sounds.  Musculoskeletal:     Cervical back: No rigidity or tenderness.   Skin:    General: Skin is warm and dry.  Neurological:     Mental Status: She is alert and oriented to person, place, and time.  Psychiatric:        Mood and Affect: Mood normal.        Behavior: Behavior normal.      No results found for any visits on 10/07/22.    The ASCVD Risk score (Arnett DK, et al., 2019) failed to calculate for the following reasons:   The 2019 ASCVD risk score is only valid for ages 23 to 37    Assessment & Plan:   Type 2 diabetes mellitus with hyperglycemia, without long-term current use of insulin (HCC) -     CBC -     Comprehensive metabolic panel -     Hemoglobin A1c -     Lipid panel -     Urinalysis, Routine w reflex microscopic -     Microalbumin / creatinine urine ratio -     Insulin, random -     Lantus SoloStar; Start with 20 U s.q. nightly.  Dispense: 15 mL; Refill: 11    Return in about 3 months (around 01/05/2023), or continue metformin and start lantus.  Encouraged her to review her notes from diabetic teaching.  No more than 60 g of carbs per meal.  20 to 30 g of carbs for snacks.  Please try to exercise after meals.  Will start Lantus at 20 units and titrate upwards by 2 to 4 units nightly until fasting sugars are less than 140 but greater than 90.  Continue breast-feeding.   Libby Maw, MD

## 2022-10-08 ENCOUNTER — Telehealth: Payer: Self-pay | Admitting: Family Medicine

## 2022-10-08 DIAGNOSIS — E1165 Type 2 diabetes mellitus with hyperglycemia: Secondary | ICD-10-CM

## 2022-10-08 LAB — INSULIN, RANDOM: Insulin: 7.3 u[IU]/mL

## 2022-10-08 MED ORDER — INSULIN PEN NEEDLE 31G X 8 MM MISC: 100.0000 | 3 refills | Status: DC

## 2022-10-08 NOTE — Telephone Encounter (Signed)
Pt was prescribed Blood Glucose Monitoring Suppl (ACCU-CHEK GUIDE ME) w/Device KIT [[id number]]. She is now needing needles to go along with this set.  [id number] - [place], [place] - [address], [place] [address] Phone: [phone]  Fax: [phone] DEA #: [id number]  Pt at [phone]

## 2022-10-08 NOTE — Telephone Encounter (Signed)
Needles sent in patient aware  

## 2022-10-09 ENCOUNTER — Other Ambulatory Visit: Payer: Self-pay

## 2022-10-09 ENCOUNTER — Telehealth: Payer: Self-pay | Admitting: Family Medicine

## 2022-10-09 DIAGNOSIS — E1165 Type 2 diabetes mellitus with hyperglycemia: Secondary | ICD-10-CM

## 2022-10-09 MED ORDER — ACCU-CHEK SOFTCLIX LANCETS MISC: 1.0000 | 3 refills | Status: DC

## 2022-10-09 NOTE — Telephone Encounter (Signed)
New lancets sent to pharmacy

## 2022-10-09 NOTE — Telephone Encounter (Signed)
The lancets that were ordered for her glucose monitor do not fit.

## 2022-10-30 ENCOUNTER — Telehealth: Payer: Self-pay | Admitting: Family Medicine

## 2022-10-30 ENCOUNTER — Other Ambulatory Visit (HOSPITAL_BASED_OUTPATIENT_CLINIC_OR_DEPARTMENT_OTHER): Payer: Self-pay

## 2022-10-30 DIAGNOSIS — E1165 Type 2 diabetes mellitus with hyperglycemia: Secondary | ICD-10-CM

## 2022-10-30 MED ORDER — METFORMIN HCL 500 MG PO TABS: 500.0000 mg | ORAL_TABLET | Freq: Two times a day (BID) | ORAL | 0 refills | Status: DC

## 2022-10-30 MED ORDER — ACCU-CHEK SOFTCLIX LANCETS MISC
1.0000 | 3 refills | Status: DC
  Filled 2022-10-30: qty 100, fill #0

## 2022-10-30 MED ORDER — METFORMIN HCL 500 MG PO TABS
500.0000 mg | ORAL_TABLET | Freq: Two times a day (BID) | ORAL | 0 refills | Status: DC
  Filled 2022-10-30: qty 180, 90d supply, fill #0

## 2022-10-30 MED ORDER — ACCU-CHEK SOFTCLIX LANCETS MISC: 1.0000 | 3 refills | Status: DC

## 2022-10-30 NOTE — Telephone Encounter (Signed)
Prescription Request  10/30/2022  Is this a "Controlled Substance" medicine? No  LOV: 10/07/2022  What is the name of the medication or equipment? Accu-Chek Softclix Lancets lancets [id number] and metFORMIN (GLUCOPHAGE) 500 MG tablet [id number]   Have you contacted your pharmacy to request a refill? Yes   Which pharmacy would you like this sent to?  [facility]  Address: [place],  Phone: [phone]  Patient notified that their request is being sent to the clinical staff for review and that they should receive a response within 2 business days.   Please advise at Mobile [phone] (mobile)

## 2023-01-01 ENCOUNTER — Ambulatory Visit (INDEPENDENT_AMBULATORY_CARE_PROVIDER_SITE_OTHER): Payer: Medicaid Other | Admitting: Family Medicine

## 2023-01-01 ENCOUNTER — Encounter: Payer: Self-pay | Admitting: Family Medicine

## 2023-01-01 ENCOUNTER — Other Ambulatory Visit: Payer: Self-pay | Admitting: Family Medicine

## 2023-01-01 VITALS — BP 130/82 | HR 84 | Temp 98.0°F | Resp 17 | Ht 66.0 in | Wt 264.5 lb

## 2023-01-01 DIAGNOSIS — Z794 Long term (current) use of insulin: Secondary | ICD-10-CM | POA: Diagnosis not present

## 2023-01-01 DIAGNOSIS — E1165 Type 2 diabetes mellitus with hyperglycemia: Secondary | ICD-10-CM | POA: Diagnosis not present

## 2023-01-01 DIAGNOSIS — Z23 Encounter for immunization: Secondary | ICD-10-CM | POA: Insufficient documentation

## 2023-01-01 DIAGNOSIS — Z6841 Body Mass Index (BMI) 40.0 and over, adult: Secondary | ICD-10-CM | POA: Diagnosis not present

## 2023-01-01 LAB — BASIC METABOLIC PANEL
BUN: 13 mg/dL (ref 6–23)
CO2: 27 mEq/L (ref 19–32)
Calcium: 8.9 mg/dL (ref 8.4–10.5)
Chloride: 104 mEq/L (ref 96–112)
Creatinine, Ser: 0.61 mg/dL (ref 0.40–1.20)
GFR: 117.29 mL/min (ref >60.00)
Glucose, Bld: 141 mg/dL — ABNORMAL HIGH (ref 70–99)
Potassium: 4.8 mEq/L (ref 3.5–5.1)
Sodium: 139 mEq/L (ref 135–145)

## 2023-01-01 LAB — HEMOGLOBIN A1C: Hgb A1c MFr Bld: 8.1 % — ABNORMAL HIGH (ref 4.6–6.5)

## 2023-01-01 NOTE — Progress Notes (Signed)
Established Patient Office Visit   Subjective:  Patient ID: Brandy Garza, female    DOB: [birth year]  Age: 32 y.o. MRN: [id number]  Chief Complaint  Patient presents with   Diabetes    3 month on diabetes  Blood sugars have been wonderful per pt     Diabetes Pertinent negatives for diabetes include no blurred vision, no polydipsia and no weakness.   Encounter Diagnoses  Name Primary?   Need for Tdap vaccination Yes   Type 2 diabetes mellitus with hyperglycemia, without long-term current use of insulin (HCC)    Morbid obesity with BMI of 40.0-44.9, adult (HCC)    Doing quite well.  On 15 units of Lantus nightly blood sugars have been in the less than 120 and over 90 range.  She did titrate upwards to 20 units in her fasting sugars were closer to 90.  Recent retinal eye check that was normal.  Her vision has returned.  She will return to her eye doctor in 3 months for adjustment of her prescription.  Normal Pap smear last year.  Feeling much better.  Her 105-year-old is starting to lean.   Review of Systems  Constitutional: Negative.   HENT: Negative.    Eyes:  Negative for blurred vision, discharge and redness.  Respiratory: Negative.    Cardiovascular: Negative.   Gastrointestinal:  Negative for abdominal pain.  Genitourinary: Negative.   Musculoskeletal: Negative.  Negative for myalgias.  Skin:  Negative for rash.  Neurological:  Negative for tingling, loss of consciousness and weakness.  Endo/Heme/Allergies:  Negative for polydipsia.     Current Outpatient Medications:    Accu-Chek Softclix Lancets lancets, 1 each by Other route as directed. Use as instructed, Disp: 100 each, Rfl: 3   Blood Glucose Monitoring Suppl (ACCU-CHEK GUIDE ME) w/Device KIT, 1 kit by Does not apply route as directed., Disp: 1 kit, Rfl: 0   glucose blood (ACCU-CHEK GUIDE) test strip, 1 each by Other route as directed. Use to check glucose daily, Disp: 100 each, Rfl: 3   ibuprofen (ADVIL,MOTRIN)  200 MG tablet, Take 600 mg by mouth once as needed. For fever, Disp: , Rfl:    insulin glargine (LANTUS SOLOSTAR) 100 UNIT/ML Solostar Pen, Start with 20 U s.q. nightly., Disp: 15 mL, Rfl: 11   Insulin Pen Needle 31G X 8 MM MISC, 100 Boxes by Does not apply route as directed., Disp: 1 each, Rfl: 3   levonorgestrel (MIRENA) 20 MCG/24HR IUD, 1 each by Intrauterine route once. Inserted 2011, Disp: , Rfl:    metFORMIN (GLUCOPHAGE) 500 MG tablet, Take 1 tablet (500 mg total) by mouth 2 (two) times daily., Disp: 180 tablet, Rfl: 0   chlorpheniramine-HYDROcodone (TUSSIONEX PENNKINETIC ER) 10-8 MG/5ML LQCR, Take 5 mLs by mouth every 12 (twelve) hours as needed. (Patient not taking: Reported on 10/07/2022), Disp: 140 mL, Rfl: 0   cyclobenzaprine (FLEXERIL) 10 MG tablet, Take 1 tablet (10 mg total) by mouth 3 (three) times daily as needed for muscle spasms. (Patient not taking: Reported on 10/07/2022), Disp: 15 tablet, Rfl: 0   naproxen sodium (ANAPROX) 220 MG tablet, Take 220 mg by mouth 2 (two) times daily with a meal. (Patient not taking: Reported on 01/01/2023), Disp: , Rfl:    Objective:     BP 130/82   Pulse 84   Temp 98 F (36.7 C) (Temporal)   Resp 17   Ht 5\' 6"  (1.676 m)   Wt 264 lb 8 oz (120 kg)   SpO2 98%  BMI 42.69 kg/m  Wt Readings from Last 3 Encounters:  01/01/23 264 lb 8 oz (120 kg)  10/07/22 242 lb (109.8 kg)  04/30/18 248 lb 10.9 oz (112.8 kg)      Physical Exam Constitutional:      General: She is not in acute distress.    Appearance: Normal appearance. She is not ill-appearing, toxic-appearing or diaphoretic.  HENT:     Head: Normocephalic and atraumatic.     Right Ear: External ear normal.     Left Ear: External ear normal.  Eyes:     General: No scleral icterus.       Right eye: No discharge.        Left eye: No discharge.     Extraocular Movements: Extraocular movements intact.     Conjunctiva/sclera: Conjunctivae normal.  Cardiovascular:     Rate and Rhythm:  Normal rate and regular rhythm.  Pulmonary:     Effort: Pulmonary effort is normal. No respiratory distress.     Breath sounds: Normal breath sounds.  Musculoskeletal:     Cervical back: No rigidity or tenderness.  Skin:    General: Skin is warm and dry.  Neurological:     Mental Status: She is alert and oriented to person, place, and time.  Psychiatric:        Mood and Affect: Mood normal.        Behavior: Behavior normal.      No results found for any visits on 01/01/23.    The ASCVD Risk score (Arnett DK, et al., 2019) failed to calculate for the following reasons:   The 2019 ASCVD risk score is only valid for ages 15 to 41    Assessment & Plan:   Need for Tdap vaccination -     Tdap vaccine greater than or equal to 7yo IM  Type 2 diabetes mellitus with hyperglycemia, without long-term current use of insulin (HCC) -     Basic metabolic panel -     Hemoglobin A1c  Morbid obesity with BMI of 40.0-44.9, adult (HCC) -     Amb Referral to Bariatric Surgery    Return in about 3 months (around 04/02/2023).  Agrees to go for bariatric consultation.  Will continue glargine for now.  It is working well for her.  Discussed briefly using a GLP-1.  Reminded her that with weight loss diabetes can resolved.  Status post recent eye check.  Mliss Sax, MD

## 2023-01-04 ENCOUNTER — Telehealth: Payer: Self-pay | Admitting: Family Medicine

## 2023-01-04 ENCOUNTER — Encounter: Payer: Self-pay | Admitting: Family Medicine

## 2023-01-04 NOTE — Telephone Encounter (Signed)
Please advise message below  °

## 2023-01-04 NOTE — Telephone Encounter (Signed)
[facility] b[facility] has called to say they do not take this pts medicaid. Please reschedule.

## 2023-01-07 NOTE — Telephone Encounter (Signed)
Referral moved to [facility]... Letter sent to patient with details

## 2023-01-08 NOTE — Telephone Encounter (Signed)
Patient aware of message below.

## 2023-03-08 ENCOUNTER — Other Ambulatory Visit: Payer: Self-pay

## 2023-03-08 ENCOUNTER — Telehealth: Payer: Self-pay | Admitting: Family Medicine

## 2023-03-08 DIAGNOSIS — E1165 Type 2 diabetes mellitus with hyperglycemia: Secondary | ICD-10-CM

## 2023-03-08 MED ORDER — METFORMIN HCL 500 MG PO TABS
500.0000 mg | ORAL_TABLET | Freq: Two times a day (BID) | ORAL | 0 refills | Status: DC
Start: 2023-03-08 — End: 2023-10-13

## 2023-03-08 NOTE — Telephone Encounter (Signed)
Pt called and said she need a refill aon metFORMIN (GLUCOPHAGE) 500 MG tablet [[id number]]  but pt stated that the pharmacy told her you did not put in a request for it

## 2023-03-08 NOTE — Progress Notes (Signed)
Rx refill sent pt aware.

## 2023-04-02 ENCOUNTER — Ambulatory Visit: Payer: Medicaid Other | Admitting: Family Medicine

## 2023-04-13 ENCOUNTER — Telehealth: Payer: Self-pay | Admitting: Family Medicine

## 2023-04-13 ENCOUNTER — Ambulatory Visit: Payer: Medicaid Other | Admitting: Family Medicine

## 2023-04-13 NOTE — Telephone Encounter (Signed)
8.6.24 no show/medcaid/ no letter has been sent out

## 2023-04-22 NOTE — Telephone Encounter (Signed)
1st no show, fee waived, letter sent to reschedule/notify of policy 

## 2023-05-26 ENCOUNTER — Inpatient Hospital Stay (HOSPITAL_BASED_OUTPATIENT_CLINIC_OR_DEPARTMENT_OTHER)
Admission: EM | Admit: 2023-05-26 | Discharge: 2023-05-30 | DRG: 418 | Disposition: A | Discharge status: Home / Self Care | Payer: Medicaid Other | Attending: Internal Medicine | Admitting: Internal Medicine

## 2023-05-26 ENCOUNTER — Other Ambulatory Visit: Payer: Self-pay

## 2023-05-26 ENCOUNTER — Encounter: Payer: Self-pay | Admitting: Internal Medicine

## 2023-05-26 ENCOUNTER — Ambulatory Visit (INDEPENDENT_AMBULATORY_CARE_PROVIDER_SITE_OTHER): Payer: Medicaid Other | Admitting: Internal Medicine

## 2023-05-26 ENCOUNTER — Emergency Department (HOSPITAL_BASED_OUTPATIENT_CLINIC_OR_DEPARTMENT_OTHER): Payer: Medicaid Other

## 2023-05-26 ENCOUNTER — Encounter (HOSPITAL_BASED_OUTPATIENT_CLINIC_OR_DEPARTMENT_OTHER): Payer: Self-pay | Admitting: Emergency Medicine

## 2023-05-26 VITALS — BP 122/80 | HR 84 | Temp 97.6°F | Ht 66.0 in | Wt 261.8 lb

## 2023-05-26 DIAGNOSIS — Z87891 Personal history of nicotine dependence: Secondary | ICD-10-CM

## 2023-05-26 DIAGNOSIS — K831 Obstruction of bile duct: Secondary | ICD-10-CM | POA: Diagnosis present

## 2023-05-26 DIAGNOSIS — R1011 Right upper quadrant pain: Secondary | ICD-10-CM | POA: Diagnosis not present

## 2023-05-26 DIAGNOSIS — R112 Nausea with vomiting, unspecified: Secondary | ICD-10-CM | POA: Diagnosis not present

## 2023-05-26 DIAGNOSIS — Z79899 Other long term (current) drug therapy: Secondary | ICD-10-CM

## 2023-05-26 DIAGNOSIS — Z7984 Long term (current) use of oral hypoglycemic drugs: Secondary | ICD-10-CM

## 2023-05-26 DIAGNOSIS — R933 Abnormal findings on diagnostic imaging of other parts of digestive tract: Secondary | ICD-10-CM

## 2023-05-26 DIAGNOSIS — E1165 Type 2 diabetes mellitus with hyperglycemia: Secondary | ICD-10-CM | POA: Diagnosis present

## 2023-05-26 DIAGNOSIS — Z6841 Body Mass Index (BMI) 40.0 and over, adult: Secondary | ICD-10-CM

## 2023-05-26 DIAGNOSIS — Z794 Long term (current) use of insulin: Secondary | ICD-10-CM

## 2023-05-26 DIAGNOSIS — Z881 Allergy status to other antibiotic agents status: Secondary | ICD-10-CM

## 2023-05-26 DIAGNOSIS — R748 Abnormal levels of other serum enzymes: Secondary | ICD-10-CM | POA: Diagnosis present

## 2023-05-26 DIAGNOSIS — K8065 Calculus of gallbladder and bile duct with chronic cholecystitis with obstruction: Principal | ICD-10-CM | POA: Diagnosis present

## 2023-05-26 DIAGNOSIS — Z91040 Latex allergy status: Secondary | ICD-10-CM

## 2023-05-26 DIAGNOSIS — R7989 Other specified abnormal findings of blood chemistry: Secondary | ICD-10-CM

## 2023-05-26 DIAGNOSIS — Z975 Presence of (intrauterine) contraceptive device: Secondary | ICD-10-CM

## 2023-05-26 HISTORY — DX: Anemia, unspecified: D64.9

## 2023-05-26 HISTORY — DX: Type 2 diabetes mellitus without complications: E11.9

## 2023-05-26 LAB — POC URINALSYSI DIPSTICK (AUTOMATED)
Bilirubin, UA: POSITIVE
Glucose, UA: NEGATIVE
Leukocytes, UA: NEGATIVE
Nitrite, UA: NEGATIVE
Protein, UA: POSITIVE
Spec Grav, UA: 1.01 (ref 1.010–1.025)
Urobilinogen, UA: 1 U/dL
pH, UA: 6 (ref 5.0–8.0)

## 2023-05-26 LAB — CBC WITH DIFFERENTIAL/PLATELET
Abs Immature Granulocytes: 0.01 10*3/uL (ref 0.00–0.07)
Basophils Absolute: 0 10*3/uL (ref 0.0–0.1)
Basophils Absolute: 0 10*3/uL (ref 0.0–0.1)
Basophils Relative: 0.4 % (ref 0.0–3.0)
Basophils Relative: 0 %
Eosinophils Absolute: 0 10*3/uL (ref 0.0–0.7)
Eosinophils Absolute: 0 10*3/uL (ref 0.0–0.5)
Eosinophils Relative: 0.6 % (ref 0.0–5.0)
Eosinophils Relative: 1 %
HCT: 41.7 % (ref 36.0–46.0)
HCT: 41.7 % (ref 36.0–46.0)
Hemoglobin: 13.4 g/dL (ref 12.0–15.0)
Hemoglobin: 13.6 g/dL (ref 12.0–15.0)
Immature Granulocytes: 0 %
Lymphocytes Relative: 18 % (ref 12.0–46.0)
Lymphocytes Relative: 16 %
Lymphs Abs: 1 10*3/uL (ref 0.7–4.0)
Lymphs Abs: 1 10*3/uL (ref 0.7–4.0)
MCH: 26.1 pg (ref 26.0–34.0)
MCHC: 32.1 g/dL (ref 30.0–36.0)
MCHC: 32.6 g/dL (ref 30.0–36.0)
MCV: 79.9 fl (ref 78.0–100.0)
MCV: 79.9 fL — ABNORMAL LOW (ref 80.0–100.0)
Monocytes Absolute: 0.7 10*3/uL (ref 0.1–1.0)
Monocytes Absolute: 0.6 10*3/uL (ref 0.1–1.0)
Monocytes Relative: 11.8 % (ref 3.0–12.0)
Monocytes Relative: 10 %
Neutro Abs: 3.9 10*3/uL (ref 1.4–7.7)
Neutro Abs: 4.3 10*3/uL (ref 1.7–7.7)
Neutrophils Relative %: 69.2 % (ref 43.0–77.0)
Neutrophils Relative %: 73 %
Platelets: 317 10*3/uL (ref 150.0–400.0)
Platelets: 325 10*3/uL (ref 150–400)
RBC: 5.22 Mil/uL — ABNORMAL HIGH (ref 3.87–5.11)
RBC: 5.22 MIL/uL — ABNORMAL HIGH (ref 3.87–5.11)
RDW: 14.6 % (ref 11.5–15.5)
RDW: 13.8 % (ref 11.5–15.5)
WBC: 5.6 10*3/uL (ref 4.0–10.5)
WBC: 5.9 10*3/uL (ref 4.0–10.5)
nRBC: 0 % (ref 0.0–0.2)

## 2023-05-26 LAB — LIPASE, BLOOD: Lipase: 22 U/L (ref 11–51)

## 2023-05-26 LAB — COMPREHENSIVE METABOLIC PANEL WITH GFR
ALT: 343 U/L — ABNORMAL HIGH (ref 0–35)
ALT: 337 U/L — ABNORMAL HIGH (ref 0–44)
AST: 118 U/L — ABNORMAL HIGH (ref 0–37)
AST: 147 U/L — ABNORMAL HIGH (ref 15–41)
Albumin: 4.4 g/dL (ref 3.5–5.2)
Albumin: 4.2 g/dL (ref 3.5–5.0)
Alkaline Phosphatase: 167 U/L — ABNORMAL HIGH (ref 39–117)
Alkaline Phosphatase: 164 U/L — ABNORMAL HIGH (ref 38–126)
Anion gap: 12 (ref 5–15)
BUN: 10 mg/dL (ref 6–23)
BUN: 12 mg/dL (ref 6–20)
CO2: 30 meq/L (ref 19–32)
CO2: 25 mmol/L (ref 22–32)
Calcium: 9.2 mg/dL (ref 8.4–10.5)
Calcium: 8.9 mg/dL (ref 8.9–10.3)
Chloride: 100 meq/L (ref 96–112)
Chloride: 99 mmol/L (ref 98–111)
Creatinine, Ser: 0.77 mg/dL (ref 0.40–1.20)
Creatinine, Ser: 0.81 mg/dL (ref 0.44–1.00)
GFR, Estimated: >60 mL/min (ref >60)
GFR: 100.92 mL/min (ref >60.00)
Glucose, Bld: 116 mg/dL — ABNORMAL HIGH (ref 70–99)
Glucose, Bld: 131 mg/dL — ABNORMAL HIGH (ref 70–99)
Potassium: 3.6 meq/L (ref 3.5–5.1)
Potassium: 3.3 mmol/L — ABNORMAL LOW (ref 3.5–5.1)
Sodium: 138 meq/L (ref 135–145)
Sodium: 136 mmol/L (ref 135–145)
Total Bilirubin: 2.9 mg/dL — ABNORMAL HIGH (ref 0.2–1.2)
Total Bilirubin: 3.3 mg/dL — ABNORMAL HIGH (ref 0.3–1.2)
Total Protein: 7.5 g/dL (ref 6.0–8.3)
Total Protein: 8.4 g/dL — ABNORMAL HIGH (ref 6.5–8.1)

## 2023-05-26 LAB — URINALYSIS, ROUTINE W REFLEX MICROSCOPIC
Glucose, UA: 100 mg/dL — AB
Ketones, ur: 15 mg/dL — AB
Leukocytes,Ua: NEGATIVE
Nitrite: NEGATIVE
Protein, ur: 30 mg/dL — AB
Specific Gravity, Urine: 1.025 (ref 1.005–1.030)
pH: 5.5 (ref 5.0–8.0)

## 2023-05-26 LAB — URINALYSIS, MICROSCOPIC (REFLEX): WBC, UA: NONE SEEN WBC/hpf (ref 0–5)

## 2023-05-26 LAB — POCT INFLUENZA A/B
Influenza A, POC: NEGATIVE
Influenza B, POC: NEGATIVE

## 2023-05-26 LAB — POCT URINE PREGNANCY: Preg Test, Ur: NEGATIVE

## 2023-05-26 LAB — POC COVID19 BINAXNOW: SARS Coronavirus 2 Ag: NEGATIVE

## 2023-05-26 LAB — PREGNANCY, URINE: Preg Test, Ur: NEGATIVE

## 2023-05-26 LAB — LIPASE: Lipase: 14 U/L (ref 11.0–59.0)

## 2023-05-26 LAB — POCT GLUCOSE (DEVICE FOR HOME USE): POC Glucose: 126 mg/dL — AB (ref 70–99)

## 2023-05-26 MED ORDER — ONDANSETRON HCL 4 MG/2ML IJ SOLN
4.0000 mg | Freq: Once | INTRAMUSCULAR | Status: AC
  Administered 2023-05-26: 4 mg via INTRAVENOUS
  Filled 2023-05-26: qty 2

## 2023-05-26 MED ORDER — FENTANYL CITRATE PF 50 MCG/ML IJ SOSY
50.0000 ug | PREFILLED_SYRINGE | Freq: Once | INTRAMUSCULAR | Status: AC
  Administered 2023-05-26: 50 ug via INTRAVENOUS
  Filled 2023-05-26: qty 1

## 2023-05-26 MED ORDER — SODIUM CHLORIDE 0.9 % IV BOLUS
500.0000 mL | Freq: Once | INTRAVENOUS | Status: AC
  Administered 2023-05-26: 500 mL via INTRAVENOUS

## 2023-05-26 MED ORDER — INSULIN ASPART 100 UNIT/ML IJ SOLN: 0.0000 [IU] | Freq: Three times a day (TID) | INTRAMUSCULAR | Status: DC

## 2023-05-26 MED ORDER — OXYCODONE HCL 5 MG PO TABS
5.0000 mg | ORAL_TABLET | Freq: Four times a day (QID) | ORAL | Status: DC | PRN
  Filled 2023-05-26: qty 1

## 2023-05-26 NOTE — Patient Instructions (Signed)
Bland diet  If pain worsens or does not let up , go to the ER!

## 2023-05-26 NOTE — ED Triage Notes (Signed)
Pt to ER with c/o RUQ abdominal pain that started Saturday.  Was seen at PCP this AM and they called her back this afternoon and told her that her liver enzymes were severely elevated and that she should come to ER.

## 2023-05-26 NOTE — Progress Notes (Signed)
[facility] [address] [place] [address] Dept: [phone] Dept Fax: [phone]  Acute Care Office Visit  Subjective:   Brandy Garza 1991/08/30 05/26/2023  Chief Complaint  Patient presents with   Fatigue   Back Pain    Pain in side of stomach Nausea and vomit started Sunday night     HPI: Brandy Garza is a 32 yo F with a PMHX of T2DM who complains of abdominal pain to RUQ onset 4 days ago. Woke up at 0300 Monday morning with diarrhea, nausea and vomiting.  RUQ pain has been persistent. Unable to eat anything without provoking pain, nausea and vomiting. Finally today, she has been able to eat baked chicken and rice and not vomit, however just ate 40 minutes ago.  Onset: 4 days ago   Associated symptoms: back pain, diarrhea  Denies CP, SHOB   Treatments tried: gas medication with no relief.   Recent sick contacts: no  Rarely drinks ETOH.   She has not taken her Metformin and Lantus for the past several days due to nausea and vomiting. Lab Results  Component Value Date   HGBA1C 8.1 (H) 01/01/2023     The following portions of the patient's history were reviewed and updated as appropriate: past medical history, past surgical history, family history, social history, allergies, medications, and problem list.   Patient Active Problem List   Diagnosis Date Noted   Morbid obesity with BMI of 40.0-44.9, adult (HCC) 01/01/2023   Type 2 diabetes mellitus with hyperglycemia, without long-term current use of insulin (HCC) 01/01/2023   Need for Tdap vaccination 01/01/2023   Past Medical History:  Diagnosis Date   Bronchitis    Past Surgical History:  Procedure Laterality Date   CESAREAN SECTION     History reviewed. No pertinent family history.  Current Outpatient Medications:    Accu-Chek Softclix Lancets lancets, 1 each by Other route as directed. Use as instructed, Disp: 100 each, Rfl: 3   Blood Glucose  Monitoring Suppl (ACCU-CHEK GUIDE ME) w/Device KIT, 1 kit by Does not apply route as directed., Disp: 1 kit, Rfl: 0   chlorpheniramine-HYDROcodone (TUSSIONEX PENNKINETIC ER) 10-8 MG/5ML LQCR, Take 5 mLs by mouth every 12 (twelve) hours as needed., Disp: 140 mL, Rfl: 0   cyclobenzaprine (FLEXERIL) 10 MG tablet, Take 1 tablet (10 mg total) by mouth 3 (three) times daily as needed for muscle spasms., Disp: 15 tablet, Rfl: 0   glucose blood (ACCU-CHEK GUIDE) test strip, 1 each by Other route as directed. Use to check glucose daily, Disp: 100 each, Rfl: 3   ibuprofen (ADVIL,MOTRIN) 200 MG tablet, Take 600 mg by mouth once as needed. For fever, Disp: , Rfl:    insulin glargine (LANTUS SOLOSTAR) 100 UNIT/ML Solostar Pen, Start with 20 U s.q. nightly., Disp: 15 mL, Rfl: 11   Insulin Pen Needle 31G X 8 MM MISC, 100 Boxes by Does not apply route as directed., Disp: 1 each, Rfl: 3   levonorgestrel (MIRENA) 20 MCG/24HR IUD, 1 each by Intrauterine route once. Inserted 2011, Disp: , Rfl:    metFORMIN (GLUCOPHAGE) 500 MG tablet, Take 1 tablet (500 mg total) by mouth 2 (two) times daily., Disp: 180 tablet, Rfl: 0   naproxen sodium (ANAPROX) 220 MG tablet, Take 220 mg by mouth 2 (two) times daily with a meal. (Patient not taking: Reported on 01/01/2023), Disp: , Rfl:  Allergies  Allergen Reactions   Latex Itching and Other (See Comments)  burn   Flagyl [Metronidazole Hcl] Rash and Other (See Comments)    Bumps all over      ROS: A complete ROS was performed with pertinent positives/negatives noted in the HPI. The remainder of the ROS are negative.    Objective:   Today's Vitals   05/26/23 1410  BP: 122/80  Pulse: 84  Temp: 97.6 F (36.4 C)  TempSrc: Temporal  SpO2: 99%  Weight: 261 lb 12.8 oz (118.8 kg)  Height: 5\' 6"  (1.676 m)    GENERAL: Well-appearing, in NAD. Well nourished.  SKIN: Pink, warm and dry. No rash.  NECK: Trachea midline. Full ROM w/o pain or tenderness. No lymphadenopathy.   RESPIRATORY: Chest wall symmetrical. Respirations even and non-labored. Breath sounds clear to auscultation bilaterally.  CARDIAC: S1, S2 present, regular rate and rhythm. Peripheral pulses 2+ bilaterally.  GI: Abdomen soft, tenderness to palpation to RUQ. (+)murphy's sign. Normoactive bowel sounds. No hepatomegaly or splenomegaly. No CVA tenderness.  EXTREMITIES: Without clubbing, cyanosis, or edema.  NEUROLOGIC:  Steady, even gait.  PSYCH/MENTAL STATUS: Alert, oriented x 3. Cooperative, appropriate mood and affect.    Results for orders placed or performed in visit on 05/26/23  POC COVID-19  Result Value Ref Range   SARS Coronavirus 2 Ag Negative Negative  POCT Influenza A/B  Result Value Ref Range   Influenza A, POC Negative Negative   Influenza B, POC Negative Negative  POCT Glucose (Device for Home Use)  Result Value Ref Range   Glucose Fasting, POC     POC Glucose 126 (A) 70 - 99 mg/dl  POCT urine pregnancy  Result Value Ref Range   Preg Test, Ur Negative Negative  POCT Urinalysis Dipstick (Automated)  Result Value Ref Range   Color, UA amber    Clarity, UA clear    Glucose, UA Negative Negative   Bilirubin, UA positive    Ketones, UA trace    Spec Grav, UA 1.010 1.010 - 1.025   Blood, UA trace    pH, UA 6.0 5.0 - 8.0   Protein, UA Positive Negative   Urobilinogen, UA 1.0 0.2 or 1.0 E.U./dL   Nitrite, UA neg    Leukocytes, UA Negative Negative      Assessment & Plan:   Right upper quadrant abdominal pain -     CBC with Differential/Platelet -     Comprehensive metabolic panel -     Lipase -     US ABDOMEN LIMITED RUQ (LIVER/GB); Future  Nausea and vomiting, unspecified vomiting type -     POC COVID-19 BinaxNow -     POCT Influenza A/B -     POCT Glucose (Device for Home Use) -     CBC with Differential/Platelet -     Comprehensive metabolic panel -     Lipase -     POCT urine pregnancy -     POCT Urinalysis Dipstick (Automated) -     US ABDOMEN LIMITED  RUQ (LIVER/GB); Future   Concern for gallbladder inflammation, STAT US and labs ordered. Patient made aware if symptoms worsen or do not improve prior to obtaining ultrasound, she is to immediately go to the emergency room or call 911.  Patient verbalized understanding and in agreement with plan of care.  Orders Placed This Encounter  Procedures   US Abdomen Limited RUQ (LIVER/GB)    Standing Status:   Future    Standing Expiration Date:   05/25/2024    Order Specific Question:   Reason for Exam (  SYMPTOM  OR DIAGNOSIS REQUIRED)    Answer:   RUQ pain, nausea and vomiting. concern for cholecystitis    Order Specific Question:   Preferred imaging location?    Answer:   Internal   CBC with Differential/Platelet   Comp Met (CMET)   Lipase   POC COVID-19    Order Specific Question:   Previously tested for COVID-19    Answer:   No    Order Specific Question:   Resident in a congregate (group) care setting    Answer:   No    Order Specific Question:   Employed in healthcare setting    Answer:   Unknown    Order Specific Question:   Pregnant    Answer:   Unknown   POCT Influenza A/B   POCT Glucose (Device for Home Use)   POCT urine pregnancy   POCT Urinalysis Dipstick (Automated)   Lab Orders         CBC with Differential/Platelet         Comp Met (CMET)         Lipase         POC COVID-19         POCT Influenza A/B         POCT Glucose (Device for Home Use)         POCT urine pregnancy         POCT Urinalysis Dipstick (Automated)     No images are attached to the encounter or orders placed in the encounter.  Return if symptoms worsen or fail to improve.   Salvatore Decent, FNP

## 2023-05-26 NOTE — ED Notes (Signed)
Re Paged/ Called for gastroenterology rep states if doctor does not get a call back in to paged back out again for consult. @ 22:40 Doctor waiting on call back now from on call doctor.

## 2023-05-26 NOTE — ED Notes (Signed)
Called out to triage, pt weak and has been vomiting continuously.  Pt also c/o extreme pain.  Blood drawn and sent, IV places and zofran given

## 2023-05-26 NOTE — ED Provider Notes (Signed)
[facility] Provider Note   CSN: [id number] Arrival date & time: 05/26/23  1743     History {Add pertinent medical, surgical, social history, OB history to HPI:1} Chief Complaint  Patient presents with   Abdominal Pain    Brandy Garza is a 32 y.o. female.  She has a history of diabetes.  She has had some on and off right upper quadrant pain for the last 3 months.  She has been taking Gas-X and other medications for this.  Since Sunday the pain is gotten worse and associated with nausea vomiting and diarrhea.  No fevers or chills.  She went to her primary care doctor today who drew labs and found her LFTs were elevated and sent her here for further evaluation.  She continues to endorse right upper quadrant pain.  No significant Tylenol use and no significant alcohol use..  The history is provided by the patient.  Abdominal Pain Pain location:  RUQ Pain quality: aching   Pain severity:  Moderate Duration:  4 days Timing:  Constant Progression:  Unchanged Chronicity:  Recurrent Relieved by:  Nothing Worsened by:  Palpation Associated symptoms: diarrhea, nausea and vomiting   Associated symptoms: no chest pain, no constipation, no cough, no dysuria, no fever, no hematemesis, no hematochezia and no hematuria        Home Medications Prior to Admission medications   Medication Sig Start Date End Date Taking? Authorizing Provider  Accu-Chek Softclix Lancets lancets 1 each by Other route as directed. Use as instructed 10/30/22   Mliss Sax, MD  Blood Glucose Monitoring Suppl (ACCU-CHEK GUIDE ME) w/Device KIT 1 kit by Does not apply route as directed. 10/07/22   Mliss Sax, MD  chlorpheniramine-HYDROcodone [facility] PENNKINETIC ER) 10-8 MG/5ML [facility] Take 5 mLs by mouth every 12 (twelve) hours as needed. 05/26/12   Margarita Grizzle, MD  cyclobenzaprine (FLEXERIL) 10 MG tablet Take 1 tablet (10 mg total) by mouth 3 (three)  times daily as needed for muscle spasms. 04/30/18   Pricilla Loveless, MD  glucose blood (ACCU-CHEK GUIDE) test strip 1 each by Other route as directed. Use to check glucose daily 10/07/22   Mliss Sax, MD  ibuprofen (ADVIL,MOTRIN) 200 MG tablet Take 600 mg by mouth once as needed. For fever    [provider]  insulin glargine (LANTUS SOLOSTAR) 100 UNIT/ML Solostar Pen Start with 20 U s.q. nightly. 10/07/22   Mliss Sax, MD  Insulin Pen Needle 31G X 8 MM MISC 100 Boxes by Does not apply route as directed. 10/08/22   Mliss Sax, MD  levonorgestrel (MIRENA) 20 MCG/24HR IUD 1 each by Intrauterine route once. Inserted 2011    [provider]  metFORMIN (GLUCOPHAGE) 500 MG tablet Take 1 tablet (500 mg total) by mouth 2 (two) times daily. 03/08/23   Mliss Sax, MD  naproxen sodium (ANAPROX) 220 MG tablet Take 220 mg by mouth 2 (two) times daily with a meal. Patient not taking: Reported on 01/01/2023    [provider]      Allergies    Latex and Flagyl [metronidazole hcl]    Review of Systems   Review of Systems  Constitutional:  Negative for fever.  Respiratory:  Negative for cough.   Cardiovascular:  Negative for chest pain.  Gastrointestinal:  Positive for abdominal pain, diarrhea, nausea and vomiting. Negative for constipation, hematemesis and hematochezia.  Genitourinary:  Negative for dysuria and hematuria.    Physical  Exam Updated Vital Signs BP 128/89   Pulse 88   Temp 98.2 F (36.8 C) (Oral)   Resp 18   Ht 5' 6.5" (1.689 m)   Wt 109.8 kg   LMP  (LMP Unknown)   SpO2 100%   BMI 38.47 kg/m  Physical Exam Vitals and nursing note reviewed.  Constitutional:      General: She is not in acute distress.    Appearance: She is well-developed.  HENT:     Head: Normocephalic and atraumatic.  Eyes:     Conjunctiva/sclera: Conjunctivae normal.  Cardiovascular:     Rate and Rhythm: Normal rate and regular rhythm.      Heart sounds: No murmur heard. Pulmonary:     Effort: Pulmonary effort is normal. No respiratory distress.     Breath sounds: Normal breath sounds.  Abdominal:     Palpations: Abdomen is soft.     Tenderness: There is abdominal tenderness in the right upper quadrant. There is no guarding or rebound.  Musculoskeletal:        General: No swelling.     Cervical back: Neck supple.  Skin:    General: Skin is warm and dry.     Capillary Refill: Capillary refill takes less than 2 seconds.  Neurological:     General: No focal deficit present.     Mental Status: She is alert.     ED Results / Procedures / Treatments   Labs (all labs ordered are listed, but only abnormal results are displayed) Labs Reviewed  CBC WITH DIFFERENTIAL/PLATELET - Abnormal; Notable for the following components:      Result Value   RBC 5.22 (*)    MCV 79.9 (*)    All other components within normal limits  COMPREHENSIVE METABOLIC PANEL - Abnormal; Notable for the following components:   Potassium 3.3 (*)    Glucose, Bld 131 (*)    Total Protein 8.4 (*)    AST 147 (*)    ALT 337 (*)    Alkaline Phosphatase 164 (*)    Total Bilirubin 3.3 (*)    All other components within normal limits  URINALYSIS, ROUTINE W REFLEX MICROSCOPIC - Abnormal; Notable for the following components:   APPearance HAZY (*)    Glucose, UA 100 (*)    Hgb urine dipstick SMALL (*)    Bilirubin Urine LARGE (*)    Ketones, ur 15 (*)    Protein, ur 30 (*)    All other components within normal limits  URINALYSIS, MICROSCOPIC (REFLEX) - Abnormal; Notable for the following components:   Bacteria, UA FEW (*)    All other components within normal limits  LIPASE, BLOOD  PREGNANCY, URINE    EKG None  Radiology US Abdomen Limited RUQ (LIVER/GB)  Result Date: 05/26/2023 CLINICAL DATA:  Right upper quadrant pain and elevated liver function tests. EXAM: ULTRASOUND ABDOMEN LIMITED RIGHT UPPER QUADRANT COMPARISON:  None Available. FINDINGS:  Gallbladder: The gallbladder is mildly distended. No gallstones or wall thickening visualized (1.5 mm). A positive sonographic Eulah Pont sign is noted by the sonographer. Common bile duct: Diameter: 13.3 mm Liver: A 0.6 cm x 1.2 cm x 1.2 cm area of hypoechogenicity is seen within the left lobe of the liver. Diffusely increased echogenicity of the liver parenchyma is noted. Mild, central intrahepatic biliary dilatation is seen. Portal vein is patent on color Doppler imaging with normal direction of blood flow towards the liver. Other: None. IMPRESSION: 1. Positive sonographic Murphy sign, in the absence of gallstones,  gallbladder wall thickening or pericholecystic fluid. Further evaluation with a nuclear medicine hepatobiliary scan is recommended if acalculous cholecystitis is of clinical concern. 2. Common bile duct dilatation without visualization of an obstructing lesion. Further evaluation with MRCP is recommended. 3. Hepatic steatosis with a small hepatic cyst versus hemangioma suspected within the left lobe of the liver. Correlation with nonemergent hepatic MRI is recommended. Electronically Signed   By: Aram Candela M.D.   On: 05/26/2023 19:05    Procedures Procedures  {Document cardiac monitor, telemetry assessment procedure when appropriate:1}  Medications Ordered in ED Medications  oxyCODONE (Oxy IR/ROXICODONE) immediate release tablet 5 mg (5 mg Oral Not Given 05/26/23 2119)  sodium chloride 0.9 % bolus 500 mL (has no administration in time range)  fentaNYL (SUBLIMAZE) injection 50 mcg (has no administration in time range)  ondansetron (ZOFRAN) injection 4 mg (4 mg Intravenous Given 05/26/23 2117)    ED Course/ Medical Decision Making/ A&P   {   Click here for ABCD2, HEART and other calculatorsREFRESH Note before signing :1}                              Medical Decision Making Risk Prescription drug management.   This patient complains of ***; this involves an extensive number of  treatment Options and is a complaint that carries with it a high risk of complications and morbidity. The differential includes ***  I ordered, reviewed and interpreted labs, which included *** I ordered medication *** and reviewed PMP when indicated. I ordered imaging studies which included *** and I independently    visualized and interpreted imaging which showed *** Additional history obtained from *** Previous records obtained and reviewed *** I consulted *** and discussed lab and imaging findings and discussed disposition.  Cardiac monitoring reviewed, *** Social determinants considered, *** Critical Interventions: ***  After the interventions stated above, I reevaluated the patient and found *** Admission and further testing considered, ***   {Document critical care time when appropriate:1} {Document review of labs and clinical decision tools ie heart score, Chads2Vasc2 etc:1}  {Document your independent review of radiology images, and any outside records:1} {Document your discussion with family members, caretakers, and with consultants:1} {Document social determinants of health affecting pt's care:1} {Document your decision making why or why not admission, treatments were needed:1} Final Clinical Impression(s) / ED Diagnoses Final diagnoses:  None    Rx / DC Orders ED Discharge Orders     None

## 2023-05-27 ENCOUNTER — Encounter (HOSPITAL_COMMUNITY): Payer: Self-pay | Admitting: Family Medicine

## 2023-05-27 ENCOUNTER — Inpatient Hospital Stay (HOSPITAL_COMMUNITY): Payer: Medicaid Other

## 2023-05-27 DIAGNOSIS — Z7984 Long term (current) use of oral hypoglycemic drugs: Secondary | ICD-10-CM | POA: Diagnosis not present

## 2023-05-27 DIAGNOSIS — R1011 Right upper quadrant pain: Secondary | ICD-10-CM | POA: Diagnosis present

## 2023-05-27 DIAGNOSIS — E1165 Type 2 diabetes mellitus with hyperglycemia: Secondary | ICD-10-CM | POA: Diagnosis present

## 2023-05-27 DIAGNOSIS — R748 Abnormal levels of other serum enzymes: Secondary | ICD-10-CM | POA: Diagnosis present

## 2023-05-27 DIAGNOSIS — K811 Chronic cholecystitis: Secondary | ICD-10-CM | POA: Diagnosis not present

## 2023-05-27 DIAGNOSIS — Z6841 Body Mass Index (BMI) 40.0 and over, adult: Secondary | ICD-10-CM | POA: Diagnosis not present

## 2023-05-27 DIAGNOSIS — K8065 Calculus of gallbladder and bile duct with chronic cholecystitis with obstruction: Secondary | ICD-10-CM | POA: Diagnosis present

## 2023-05-27 DIAGNOSIS — Z975 Presence of (intrauterine) contraceptive device: Secondary | ICD-10-CM | POA: Diagnosis not present

## 2023-05-27 DIAGNOSIS — K831 Obstruction of bile duct: Secondary | ICD-10-CM

## 2023-05-27 DIAGNOSIS — Z881 Allergy status to other antibiotic agents status: Secondary | ICD-10-CM | POA: Diagnosis not present

## 2023-05-27 DIAGNOSIS — K8051 Calculus of bile duct without cholangitis or cholecystitis with obstruction: Secondary | ICD-10-CM | POA: Diagnosis not present

## 2023-05-27 DIAGNOSIS — E119 Type 2 diabetes mellitus without complications: Secondary | ICD-10-CM | POA: Diagnosis not present

## 2023-05-27 DIAGNOSIS — Z794 Long term (current) use of insulin: Secondary | ICD-10-CM | POA: Diagnosis not present

## 2023-05-27 DIAGNOSIS — Z79899 Other long term (current) drug therapy: Secondary | ICD-10-CM | POA: Diagnosis not present

## 2023-05-27 DIAGNOSIS — Z87891 Personal history of nicotine dependence: Secondary | ICD-10-CM | POA: Diagnosis not present

## 2023-05-27 DIAGNOSIS — Z91040 Latex allergy status: Secondary | ICD-10-CM | POA: Diagnosis not present

## 2023-05-27 LAB — GLUCOSE, CAPILLARY
Glucose-Capillary: 132 mg/dL — ABNORMAL HIGH (ref 70–99)
Glucose-Capillary: 115 mg/dL — ABNORMAL HIGH (ref 70–99)
Glucose-Capillary: 106 mg/dL — ABNORMAL HIGH (ref 70–99)
Glucose-Capillary: 130 mg/dL — ABNORMAL HIGH (ref 70–99)
Glucose-Capillary: 98 mg/dL (ref 70–99)
Glucose-Capillary: 102 mg/dL — ABNORMAL HIGH (ref 70–99)

## 2023-05-27 LAB — PROTIME-INR
INR: 1 (ref 0.8–1.2)
Prothrombin Time: 13.5 seconds (ref 11.4–15.2)

## 2023-05-27 LAB — COMPREHENSIVE METABOLIC PANEL
ALT: 289 U/L — ABNORMAL HIGH (ref 0–44)
AST: 120 U/L — ABNORMAL HIGH (ref 15–41)
Albumin: 3.5 g/dL (ref 3.5–5.0)
Alkaline Phosphatase: 142 U/L — ABNORMAL HIGH (ref 38–126)
Anion gap: 10 (ref 5–15)
BUN: 8 mg/dL (ref 6–20)
CO2: 25 mmol/L (ref 22–32)
Calcium: 8.9 mg/dL (ref 8.9–10.3)
Chloride: 101 mmol/L (ref 98–111)
Creatinine, Ser: 0.76 mg/dL (ref 0.44–1.00)
GFR, Estimated: >60 mL/min (ref >60)
Glucose, Bld: 123 mg/dL — ABNORMAL HIGH (ref 70–99)
Potassium: 3.7 mmol/L (ref 3.5–5.1)
Sodium: 136 mmol/L (ref 135–145)
Total Bilirubin: 3.3 mg/dL — ABNORMAL HIGH (ref 0.3–1.2)
Total Protein: 7.1 g/dL (ref 6.5–8.1)

## 2023-05-27 LAB — CBC
HCT: 38.9 % (ref 36.0–46.0)
Hemoglobin: 12.3 g/dL (ref 12.0–15.0)
MCH: 25.4 pg — ABNORMAL LOW (ref 26.0–34.0)
MCHC: 31.6 g/dL (ref 30.0–36.0)
MCV: 80.2 fL (ref 80.0–100.0)
Platelets: 304 10*3/uL (ref 150–400)
RBC: 4.85 MIL/uL (ref 3.87–5.11)
RDW: 13.8 % (ref 11.5–15.5)
WBC: 5.8 10*3/uL (ref 4.0–10.5)
nRBC: 0 % (ref 0.0–0.2)

## 2023-05-27 LAB — HIV ANTIBODY (ROUTINE TESTING W REFLEX): HIV Screen 4th Generation wRfx: NONREACTIVE

## 2023-05-27 LAB — MAGNESIUM: Magnesium: 2.3 mg/dL (ref 1.7–2.4)

## 2023-05-27 MED ORDER — ONDANSETRON HCL 4 MG/2ML IJ SOLN: 4.0000 mg | Freq: Four times a day (QID) | INTRAMUSCULAR | Status: DC | PRN

## 2023-05-27 MED ORDER — HYDROMORPHONE HCL 1 MG/ML IJ SOLN
0.5000 mg | INTRAMUSCULAR | Status: DC | PRN
  Administered 2023-05-27 – 2023-05-30 (×3): 1 mg via INTRAVENOUS
  Filled 2023-05-27 (×3): qty 1

## 2023-05-27 MED ORDER — GADOBUTROL 1 MMOL/ML IV SOLN
10.0000 mL | Freq: Once | INTRAVENOUS | Status: AC | PRN
  Administered 2023-05-27: 10 mL via INTRAVENOUS

## 2023-05-27 MED ORDER — ONDANSETRON HCL 4 MG PO TABS: 4.0000 mg | ORAL_TABLET | Freq: Four times a day (QID) | ORAL | Status: DC | PRN

## 2023-05-27 MED ORDER — ACETAMINOPHEN 500 MG PO TABS
1000.0000 mg | ORAL_TABLET | Freq: Once | ORAL | Status: AC
  Administered 2023-05-27: 1000 mg via ORAL
  Filled 2023-05-27: qty 2

## 2023-05-27 MED ORDER — IBUPROFEN 200 MG PO TABS: 400.0000 mg | ORAL_TABLET | Freq: Four times a day (QID) | ORAL | Status: DC | PRN

## 2023-05-27 MED ORDER — POTASSIUM CHLORIDE IN NACL 20-0.9 MEQ/L-% IV SOLN
INTRAVENOUS | Status: AC
  Filled 2023-05-27 (×3): qty 1000

## 2023-05-27 MED ORDER — INSULIN ASPART 100 UNIT/ML IJ SOLN
0.0000 [IU] | INTRAMUSCULAR | Status: DC
  Administered 2023-05-30: 2 [IU] via SUBCUTANEOUS
  Administered 2023-05-30: 1 [IU] via SUBCUTANEOUS

## 2023-05-27 NOTE — H&P (Signed)
History and Physical    Brandy Garza VHQ:[id number] DOB: 1991-02-23 DOA: 05/26/2023  PCP: Mliss Sax, MD   Patient coming from: Home   Chief Complaint: RUQ pain, N/V/D   HPI: Brandy Garza is a pleasant 32 y.o. female with medical history significant for type 2 diabetes mellitus and BMI 42 who presents with right upper quadrant pain, nausea, vomiting, and diarrhea.  Patient reports occasional episodes of right upper quadrant pain over the past 3 months that she had attributed to gas, but has had constant and more severe RUQ pain since 05/23/2023.  She has also had nausea with many episodes of vomiting and loose stools.  She denies fevers.  [facility] ED Course: Upon arrival to the ED, patient is found to be afebrile and saturating well on room air with stable blood pressure.  Labs are most notable for AST 147, ALT 337, total bilirubin 3.3, normal WBC, and normal lipase.  Right upper quadrant ultrasound reveals CBD dilation to 13.3 mm and positive sonographic Murphy sign without gallstones, gallbladder wall thickening, or pericholecystic fluid.  GI (Dr. Lelon Huh) was consulted by the ED physician, patient was treated with acetaminophen, fentanyl, Zofran, and IV fluids, and she was transferred to [facility] for admission.  Review of Systems:  All other systems reviewed and apart from HPI, are negative.  Past Medical History:  Diagnosis Date   Bronchitis     Past Surgical History:  Procedure Laterality Date   CESAREAN SECTION      Social History:   reports that she has quit smoking. She has never used smokeless tobacco. She reports current alcohol use. She reports that she does not use drugs.  Allergies  Allergen Reactions   Latex Itching and Other (See Comments)    burn   Flagyl [Metronidazole Hcl] Rash and Other (See Comments)    Bumps all over     History reviewed. No pertinent family history.   Prior to Admission medications   Medication Sig Start Date  End Date Taking? Authorizing Provider  Accu-Chek Softclix Lancets lancets 1 each by Other route as directed. Use as instructed 10/30/22   Mliss Sax, MD  Blood Glucose Monitoring Suppl (ACCU-CHEK GUIDE ME) w/Device KIT 1 kit by Does not apply route as directed. 10/07/22   Mliss Sax, MD  chlorpheniramine-HYDROcodone [facility] PENNKINETIC ER) 10-8 MG/5ML [facility] Take 5 mLs by mouth every 12 (twelve) hours as needed. 05/26/12   Margarita Grizzle, MD  cyclobenzaprine (FLEXERIL) 10 MG tablet Take 1 tablet (10 mg total) by mouth 3 (three) times daily as needed for muscle spasms. 04/30/18   Pricilla Loveless, MD  glucose blood (ACCU-CHEK GUIDE) test strip 1 each by Other route as directed. Use to check glucose daily 10/07/22   Mliss Sax, MD  ibuprofen (ADVIL,MOTRIN) 200 MG tablet Take 600 mg by mouth once as needed. For fever    [provider]  insulin glargine (LANTUS SOLOSTAR) 100 UNIT/ML Solostar Pen Start with 20 U s.q. nightly. 10/07/22   Mliss Sax, MD  Insulin Pen Needle 31G X 8 MM MISC 100 Boxes by Does not apply route as directed. 10/08/22   Mliss Sax, MD  levonorgestrel (MIRENA) 20 MCG/24HR IUD 1 each by Intrauterine route once. Inserted 2011    [provider]  metFORMIN (GLUCOPHAGE) 500 MG tablet Take 1 tablet (500 mg total) by mouth 2 (two) times daily. 03/08/23   Mliss Sax, MD  naproxen sodium (ANAPROX) 220 MG tablet Take 220 mg  by mouth 2 (two) times daily with a meal. Patient not taking: Reported on 01/01/2023    [provider]    Physical Exam: Vitals:   05/27/23 0130 05/27/23 0215 05/27/23 0310 05/27/23 0429  BP: (!) 132/91 126/86  118/87  Pulse: 77 82  81  Resp: 17 16  18   Temp:   97.9 F (36.6 C) 98.2 F (36.8 C)  TempSrc:   Oral Oral  SpO2: 99% 98%  98%  Weight:    118.4 kg  Height:         Constitutional: NAD, no pallor or diaphoresis   Eyes: PERTLA, lids and conjunctivae normal ENMT:  Mucous membranes are moist. Posterior pharynx clear of any exudate or lesions.   Neck: supple, no masses  Respiratory: no wheezing, no crackles. No accessory muscle use.  Cardiovascular: S1 & S2 heard, regular rate and rhythm. No extremity edema.   Abdomen: Soft, no distension, no guarding. Bowel sounds active.  Musculoskeletal: no clubbing / cyanosis. No joint deformity upper and lower extremities.   Skin: no significant rashes, lesions, ulcers. Warm, dry, well-perfused. Neurologic: CN 2-12 grossly intact. Moving all extremities. Alert and oriented.  Psychiatric: Calm. Cooperative.    Labs and Imaging on Admission: I have personally reviewed following labs and imaging studies  CBC: Recent Labs  Lab 05/26/23 1447 05/26/23 1758  WBC 5.6 5.9  NEUTROABS 3.9 4.3  HGB 13.4 13.6  HCT 41.7 41.7  MCV 79.9 79.9*  PLT 317.0 325   Basic Metabolic Panel: Recent Labs  Lab 05/26/23 1447 05/26/23 1758  NA 138 136  K 3.6 3.3*  CL 100 99  CO2 30 25  GLUCOSE 116* 131*  BUN 10 12  CREATININE 0.77 0.81  CALCIUM 9.2 8.9   GFR: Estimated Creatinine Clearance: 129.3 mL/min (by C-G formula based on SCr of 0.81 mg/dL). Liver Function Tests: Recent Labs  Lab 05/26/23 1447 05/26/23 1758  AST 118* 147*  ALT 343* 337*  ALKPHOS 167* 164*  BILITOT 2.9* 3.3*  PROT 7.5 8.4*  ALBUMIN 4.4 4.2   Recent Labs  Lab 05/26/23 1447 05/26/23 1758  LIPASE 14.0 22   No results for input(s): "AMMONIA" in the last 168 hours. Coagulation Profile: No results for input(s): "INR", "PROTIME" in the last 168 hours. Cardiac Enzymes: No results for input(s): "CKTOTAL", "CKMB", "CKMBINDEX", "TROPONINI" in the last 168 hours. BNP (last 3 results) No results for input(s): "PROBNP" in the last 8760 hours. HbA1C: No results for input(s): "HGBA1C" in the last 72 hours. CBG: Recent Labs  Lab 05/27/23 0428  GLUCAP 132*   Lipid Profile: No results for input(s): "CHOL", "HDL", "LDLCALC", "TRIG", "CHOLHDL",  "LDLDIRECT" in the last 72 hours. Thyroid Function Tests: No results for input(s): "TSH", "T4TOTAL", "FREET4", "T3FREE", "THYROIDAB" in the last 72 hours. Anemia Panel: No results for input(s): "VITAMINB12", "FOLATE", "FERRITIN", "TIBC", "IRON", "RETICCTPCT" in the last 72 hours. Urine analysis:    Component Value Date/Time   COLORURINE YELLOW 05/26/2023 1758   APPEARANCEUR HAZY (A) 05/26/2023 1758   LABSPEC 1.025 05/26/2023 1758   PHURINE 5.5 05/26/2023 1758   GLUCOSEU 100 (A) 05/26/2023 1758   GLUCOSEU >=1000 (A) 10/07/2022 0917   HGBUR SMALL (A) 05/26/2023 1758   BILIRUBINUR LARGE (A) 05/26/2023 1758   BILIRUBINUR positive 05/26/2023 1501   KETONESUR 15 (A) 05/26/2023 1758   PROTEINUR 30 (A) 05/26/2023 1758   PROTEINUR Positive 05/26/2023 1501   UROBILINOGEN 1.0 05/26/2023 1501   UROBILINOGEN 0.2 10/07/2022 0917   NITRITE NEGATIVE 05/26/2023 1758  NITRITE neg 05/26/2023 1501   LEUKOCYTESUR NEGATIVE 05/26/2023 1758   LEUKOCYTESUR Negative 05/26/2023 1501   Sepsis Labs: @LABRCNTIP (procalcitonin:4,lacticidven:4) )No results found for this or any previous visit (from the past 240 hour(s)).   Radiological Exams on Admission: US Abdomen Limited RUQ (LIVER/GB)  Result Date: 05/26/2023 CLINICAL DATA:  Right upper quadrant pain and elevated liver function tests. EXAM: ULTRASOUND ABDOMEN LIMITED RIGHT UPPER QUADRANT COMPARISON:  None Available. FINDINGS: Gallbladder: The gallbladder is mildly distended. No gallstones or wall thickening visualized (1.5 mm). A positive sonographic Eulah Pont sign is noted by the sonographer. Common bile duct: Diameter: 13.3 mm Liver: A 0.6 cm x 1.2 cm x 1.2 cm area of hypoechogenicity is seen within the left lobe of the liver. Diffusely increased echogenicity of the liver parenchyma is noted. Mild, central intrahepatic biliary dilatation is seen. Portal vein is patent on color Doppler imaging with normal direction of blood flow towards the liver. Other: None.  IMPRESSION: 1. Positive sonographic Murphy sign, in the absence of gallstones, gallbladder wall thickening or pericholecystic fluid. Further evaluation with a nuclear medicine hepatobiliary scan is recommended if acalculous cholecystitis is of clinical concern. 2. Common bile duct dilatation without visualization of an obstructing lesion. Further evaluation with MRCP is recommended. 3. Hepatic steatosis with a small hepatic cyst versus hemangioma suspected within the left lobe of the liver. Correlation with nonemergent hepatic MRI is recommended. Electronically Signed   By: Aram Candela M.D.   On: 05/26/2023 19:05     Assessment/Plan   1. Biliary obstruction  - Presents with RUQ pain and found to have elevated transaminases, t bili 3.3, and CBD 13.3 mm on [place] but no stones or obstructing lesion identified  - Lipase is normal and there is no evidence for infection  - Continue bowel rest, symptom-management, check MRCP, follow-up on GI recommendations    2. Type II DM  - A1c was 8.1% in April 2024  - Check CBGs and use low-intensity SSI for now     DVT prophylaxis: SCDs  Code Status: Full  Level of Care: Level of care: Med-Surg Family Communication: None present   Disposition Plan:  Patient is from: home  Anticipated d/c is to: Home  Anticipated d/c date is: 05/30/23  Patient currently: Pending MRCP, GI consultation  Consults called: GI  Admission status: Inpatient     Briscoe Deutscher, MD [facility]  05/27/2023, 4:42 AM

## 2023-05-27 NOTE — Progress Notes (Signed)
PROGRESS NOTE  Brandy Garza  XBM:[id number] DOB: [birth year] DOA: 05/26/2023 PCP: Mliss Sax, MD   Brief Narrative: Patient is a 32 year old female with history of type 2 diabetes, morbid obesity who presented with right upper quadrant pain, nausea, vomiting, diarrhea.  On presentation she was hemodynamically stable.  Lab work showed elevated liver enzymes, elevated bilirubin.  No leukocytosis or elevated lipase.  Right upper quadrant ultrasound showed dilated CBD of 13 mm , positive sonographic Murphy sign without gallstones or gallbladder wall thickening or pericholecystic fluid.  GI consulted.  Plan for MRCP  Assessment & Plan:  Principal Problem:   Biliary obstruction Active Problems:   Type 2 diabetes mellitus with hyperglycemia, without long-term current use of insulin (HCC)   Abdominal pain/biliary obstruction: Presented with right upper quadrant pain, nausea, vomiting..  Lab work showed elevated liver enzymes, elevated bilirubin.  No leukocytosis or elevated lipase.  Right upper quadrant ultrasound showed 13 mm CBD, positive sonographic Murphy sign without gallstones or gallbladder wall thickening or pericholecystic fluid.  GI consulted.  Plan for MRCP  Type 2 diabetes: Recent A1c of 8.1.  Currently on sliding scale  insulin.  Morbid obesity: BMI 41.5        DVT prophylaxis:SCDs Start: 05/27/23 0441     Code Status: Full Code  Family Communication: None at bedside  Patient status:Inpatient  Patient is from :home  Anticipated discharge UU:VOZD  Estimated DC date:tomorrow   Consultants: GI  Procedures:None  Antimicrobials:  Anti-infectives (From admission, onward)    None       Subjective: Patient seen and examined at bedside today.  Hemodynamically stable very comfortable this morning.  Denies abdomen, nausea or vomiting.  Her abdomen pain has completely resolved.  Objective: Vitals:   05/27/23 0130 05/27/23 0215 05/27/23 0310 05/27/23  0429  BP: (!) 132/91 126/86  118/87  Pulse: 77 82  81  Resp: 17 16  18   Temp:   97.9 F (36.6 C) 98.2 F (36.8 C)  TempSrc:   Oral Oral  SpO2: 99% 98%  98%  Weight:    118.4 kg  Height:        Intake/Output Summary (Last 24 hours) at 05/27/2023 0827 Last data filed at 05/27/2023 0650 Gross per 24 hour  Intake 656.21 ml  Output --  Net 656.21 ml   Filed Weights   05/26/23 1752 05/27/23 0429  Weight: 109.8 kg 118.4 kg    Examination:  General exam: Overall comfortable, not in distress,obese HEENT: PERRL Respiratory system:  no wheezes or crackles  Cardiovascular system: S1 & S2 heard, RRR.  Gastrointestinal system: Abdomen is nondistended, soft and nontender. Central nervous system: Alert and oriented Extremities: No edema, no clubbing ,no cyanosis Skin: No rashes, no ulcers,no icterus     Data Reviewed: I have personally reviewed following labs and imaging studies  CBC: Recent Labs  Lab 05/26/23 1447 05/26/23 1758 05/27/23 0449  WBC 5.6 5.9 5.8  NEUTROABS 3.9 4.3  --   HGB 13.4 13.6 12.3  HCT 41.7 41.7 38.9  MCV 79.9 79.9* 80.2  PLT 317.0 325 304   Basic Metabolic Panel: Recent Labs  Lab 05/26/23 1447 05/26/23 1758 05/27/23 0449  NA 138 136 136  K 3.6 3.3* 3.7  CL 100 99 101  CO2 30 25 25   GLUCOSE 116* 131* 123*  BUN 10 12 8   CREATININE 0.77 0.81 0.76  CALCIUM 9.2 8.9 8.9  MG  --   --  2.3     No results found  for this or any previous visit (from the past 240 hour(s)).   Radiology Studies: US Abdomen Limited RUQ (LIVER/GB)  Result Date: 05/26/2023 CLINICAL DATA:  Right upper quadrant pain and elevated liver function tests. EXAM: ULTRASOUND ABDOMEN LIMITED RIGHT UPPER QUADRANT COMPARISON:  None Available. FINDINGS: Gallbladder: The gallbladder is mildly distended. No gallstones or wall thickening visualized (1.5 mm). A positive sonographic Eulah Pont sign is noted by the sonographer. Common bile duct: Diameter: 13.3 mm Liver: A 0.6 cm x 1.2 cm x 1.2  cm area of hypoechogenicity is seen within the left lobe of the liver. Diffusely increased echogenicity of the liver parenchyma is noted. Mild, central intrahepatic biliary dilatation is seen. Portal vein is patent on color Doppler imaging with normal direction of blood flow towards the liver. Other: None. IMPRESSION: 1. Positive sonographic Murphy sign, in the absence of gallstones, gallbladder wall thickening or pericholecystic fluid. Further evaluation with a nuclear medicine hepatobiliary scan is recommended if acalculous cholecystitis is of clinical concern. 2. Common bile duct dilatation without visualization of an obstructing lesion. Further evaluation with MRCP is recommended. 3. Hepatic steatosis with a small hepatic cyst versus hemangioma suspected within the left lobe of the liver. Correlation with nonemergent hepatic MRI is recommended. Electronically Signed   By: Aram Candela M.D.   On: 05/26/2023 19:05    Scheduled Meds:  insulin aspart  0-6 Units Subcutaneous Q4H   Continuous Infusions:  0.9 % NaCl with KCl 20 mEq / L 100 mL/hr at 05/27/23 0650     LOS: 0 days   Burnadette Pop, MD [facility] P9/19/2024, 8:27 AM

## 2023-05-27 NOTE — Care Management (Signed)
Transition of Care [facility]) Screening Note   Patient Details  Name: Brandy Garza Date of Birth: 1990-12-21   Transition of Care [facility]) CM/SW Contact:    Lockie Pares, RN Phone Number: 05/27/2023, 1:38 PM   Patient presented with abdominal pain and underwent MRCP. Transition of Care Department [facility]) has reviewed patient and no TOC needs have been identified at this time. We will continue to monitor patient advancement through interdisciplinary progression rounds. If new patient transition needs arise, please place a TOC consult.

## 2023-05-27 NOTE — H&P (View-Only) (Signed)
Consultation  Referring Provider: TRH/Adhikari Primary Care Physician:  Mliss Sax, MD Primary Gastroenterologist: None/unassigned  Reason for Consultation: Episodic right upper quadrant pain, nausea vomiting, dilated CBD and elevated LFTs  HPI: Brandy Garza is a 32 y.o. female, generally in good health with morbid obesity, BMI of 42, and adult onset diabetes mellitus.  She is status post C-section x 2. She was admitted yesterday after she presented to the emergency room with complaints of worsening right upper quadrant pain nausea and vomiting over the past 4 to 5 days.  She says that she has been having intermittent symptoms over the past 3 months maybe occurring 3 times per month and lasting for multiple hours.  She will have episodes of right upper quadrant pain which usually will radiate to her right shoulder blade cause nausea, no vomiting.  She thought this may have been gas and was usually treating with heating pads and acids etc.  She had worsening symptoms over the past week and had a prolonged episode starting on 05/23/2023 which never resolved.  Since then she had been having intermittent hot and cold spells at home but was not able to document a fever, was having intermittent nausea and vomiting and again persistent pain.  Yesterday she noticed that her urine was very dark which precipitated coming to the ER. Upper abdominal ultrasound showed a stented gallbladder but no definite gallstones and CBD of 13 mm, evidence of hepatic steatosis and a cyst versus hemangioma in the liver.  Labs on 05/25/2023 WBC 5.6/hemoglobin 13.4/potassium 3.6 BUN 10/creatinine 0.77 T. bili 2.9/alk phos 167/AST 118/ALT 313 and lipase normal.  Today potassium 3.7 T. bili 3.3/AST 120/ALT 285 WBC 5.8 and hemoglobin 12.3  She underwent MRCP earlier this morning, report is pending She says she feels much more comfortable currently after having pain medication in the ER and antiemetics.  She  is still sore in the right upper quadrant.  Hungry.  She asks about breast-feeding, still nursing her 43-year-old at nighttime and is concerned about ability to nurse with pain meds etc.  Currently trying to wean   Past Medical History:  Diagnosis Date   Bronchitis     Past Surgical History:  Procedure Laterality Date   CESAREAN SECTION      Prior to Admission medications   Medication Sig Start Date End Date Taking? Authorizing Provider  ibuprofen (ADVIL,MOTRIN) 200 MG tablet Take 600 mg by mouth once as needed for headache or moderate pain.   Yes [provider]  insulin glargine (LANTUS SOLOSTAR) 100 UNIT/ML Solostar Pen Start with 20 U s.q. nightly. 10/07/22  Yes Mliss Sax, MD  levonorgestrel [facility]) 20 MCG/24HR IUD 1 each by Intrauterine route once. Inserted 2011   Yes [provider]  metFORMIN (GLUCOPHAGE) 500 MG tablet Take 1 tablet (500 mg total) by mouth 2 (two) times daily. 03/08/23  Yes Mliss Sax, MD    Current Facility-Administered Medications  Medication Dose Route Frequency Provider Last Rate Last Admin   0.9 % NaCl with KCl 20 mEq/ L  infusion   Intravenous Continuous Opyd, Lavone Neri, MD 100 mL/hr at 05/27/23 0650 Infusion Verify at 05/27/23 0650   HYDROmorphone (DILAUDID) injection 0.5-1 mg  0.5-1 mg Intravenous Q4H PRN Opyd, Lavone Neri, MD       ibuprofen (ADVIL) tablet 400 mg  400 mg Oral Q6H PRN Opyd, Lavone Neri, MD       insulin aspart (novoLOG) injection 0-6 Units  0-6 Units Subcutaneous Q4H Opyd,  Lavone Neri, MD       ondansetron [facility]) tablet 4 mg  4 mg Oral Q6H PRN Opyd, Lavone Neri, MD       Or   ondansetron (ZOFRAN) injection 4 mg  4 mg Intravenous Q6H PRN Opyd, Lavone Neri, MD        Allergies as of 05/26/2023 - Review Complete 05/26/2023  Allergen Reaction Noted   Latex Itching and Other (See Comments) 03/31/2011   Flagyl [metronidazole hcl] Rash and Other (See Comments) 03/31/2011    History reviewed. No pertinent  family history.  Social History   Socioeconomic History   Marital status: Single    Spouse name: Not on file   Number of children: Not on file   Years of education: Not on file   Highest education level: Not on file  Occupational History   Not on file  Tobacco Use   Smoking status: Former   Smokeless tobacco: Never  Substance and Sexual Activity   Alcohol use: Yes   Drug use: No   Sexual activity: Not on file  Other Topics Concern   Not on file  Social History Narrative   Not on file   Social Determinants of Health   Financial Resource Strain: Not on file  Food Insecurity: No Food Insecurity (05/27/2023)   Hunger Vital Sign    Worried About Running Out of Food in the Last Year: Never true    Ran Out of Food in the Last Year: Never true  Transportation Needs: No Transportation Needs (05/27/2023)   PRAPARE - Administrator, Civil Service (Medical): No    Lack of Transportation (Non-Medical): No  Physical Activity: Not on file  Stress: Not on file  Social Connections: Not on file  Intimate Partner Violence: Not At Risk (05/27/2023)   Humiliation, Afraid, Rape, and Kick questionnaire    Fear of Current or Ex-Partner: No    Emotionally Abused: No    Physically Abused: No    Sexually Abused: No    Review of Systems: Pertinent positive and negative review of systems were noted in the above HPI section.  All other review of systems was otherwise negative.   Physical Exam: Vital signs in last 24 hours: Temp:  [97.6 F (36.4 C)-98.4 F (36.9 C)] 97.9 F (36.6 C) (09/19 1042) Pulse Rate:  [72-101] 72 (09/19 1042) Resp:  [16-20] 18 (09/19 0429) BP: (118-146)/(80-98) 122/81 (09/19 1042) SpO2:  [98 %-100 %] 100 % (09/19 1042) Weight:  [109.8 kg-118.8 kg] 118.4 kg (09/19 0429) Last BM Date : 05/26/23 General:   Alert,  Well-developed, well-nourished,obese AA female, very  pleasant and cooperative in NAD Head:  Normocephalic and atraumatic. Eyes:  Sclera with  early icteris  Conjunctiva pink. Ears:  Normal auditory acuity. Nose:  No deformity, discharge,  or lesions. Mouth:  No deformity or lesions.   Neck:  Supple; no masses or thyromegaly. Lungs:  Clear throughout to auscultation.   No wheezes, crackles, or rhonchi.  Heart:  Regular rate and rhythm; no murmurs, clicks, rubs,  or gallops. Abdomen:  obese Soft, he is tender in the right upper quadrant with some guarding, no rebound, BS active,nonpalp mass or hsm.   Rectal: Not done Msk:  Symmetrical without gross deformities. . Pulses:  Normal pulses noted. Extremities:  Without clubbing or edema. Neurologic:  Alert and  oriented x4;  grossly normal neurologically. Skin:  Intact without significant lesions or rashes.. Psych:  Alert and cooperative. Normal mood and affect.  Intake/Output  from previous day: 09/18 0701 - 09/19 0700 In: 656.2 [I.V.:175; IV Piggyback:481.2] Out: -  Intake/Output this shift: No intake/output data recorded.  Lab Results: Recent Labs    05/26/23 1447 05/26/23 1758 05/27/23 0449  WBC 5.6 5.9 5.8  HGB 13.4 13.6 12.3  HCT 41.7 41.7 38.9  PLT 317.0 325 304   BMET Recent Labs    05/26/23 1447 05/26/23 1758 05/27/23 0449  NA 138 136 136  K 3.6 3.3* 3.7  CL 100 99 101  CO2 30 25 25   GLUCOSE 116* 131* 123*  BUN 10 12 8   CREATININE 0.77 0.81 0.76  CALCIUM 9.2 8.9 8.9   LFT Recent Labs    05/27/23 0449  PROT 7.1  ALBUMIN 3.5  AST 120*  ALT 289*  ALKPHOS 142*  BILITOT 3.3*   PT/INR No results for input(s): "LABPROT", "INR" in the last 72 hours. Hepatitis Panel No results for input(s): "HEPBSAG", "HCVAB", "HEPAIGM", "HEPBIGM" in the last 72 hours.    IMPRESSION:  #71 32 year old African-American female with 42-month history of intermittent right upper quadrant pain radiating to the scapula and associated with nausea.  She had an episode about 5 days ago worse than all of the other previous episodes, persistent and associated with nausea  vomiting intermittent hot and cold spells and then darkening of her urine.  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  We reviewed biliary images, and I discussed ERCP and stone extraction in detail with patient including indications risks and benefits, all questions were answered.  She is agreeable to proceed if indicated. Also discussed potential need for surgical consultation for cholecystectomy  Will make her n.p.o. after midnight in anticipation of possible ERCP and stone extraction tomorrow afternoon with Dr. Russella Dar.  Will follow-up with patient after MRCP reviewed   Brandy Shamoon EsterwoodPA-C  05/27/2023, 10:44 AM

## 2023-05-27 NOTE — ED Notes (Signed)
[facility] called for transport @0315 

## 2023-05-27 NOTE — Consult Note (Signed)
Consultation  Referring Provider: TRH/Adhikari Primary Care Physician:  Mliss Sax, MD Primary Gastroenterologist: None/unassigned  Reason for Consultation: Episodic right upper quadrant pain, nausea vomiting, dilated CBD and elevated LFTs  HPI: Brandy Garza is a 32 y.o. female, generally in good health with morbid obesity, BMI of 42, and adult onset diabetes mellitus.  She is status post C-section x 2. She was admitted yesterday after she presented to the emergency room with complaints of worsening right upper quadrant pain nausea and vomiting over the past 4 to 5 days.  She says that she has been having intermittent symptoms over the past 3 months maybe occurring 3 times per month and lasting for multiple hours.  She will have episodes of right upper quadrant pain which usually will radiate to her right shoulder blade cause nausea, no vomiting.  She thought this may have been gas and was usually treating with heating pads and acids etc.  She had worsening symptoms over the past week and had a prolonged episode starting on 05/23/2023 which never resolved.  Since then she had been having intermittent hot and cold spells at home but was not able to document a fever, was having intermittent nausea and vomiting and again persistent pain.  Yesterday she noticed that her urine was very dark which precipitated coming to the ER. Upper abdominal ultrasound showed a stented gallbladder but no definite gallstones and CBD of 13 mm, evidence of hepatic steatosis and a cyst versus hemangioma in the liver.  Labs on 05/25/2023 WBC 5.6/hemoglobin 13.4/potassium 3.6 BUN 10/creatinine 0.77 T. bili 2.9/alk phos 167/AST 118/ALT 313 and lipase normal.  Today potassium 3.7 T. bili 3.3/AST 120/ALT 285 WBC 5.8 and hemoglobin 12.3  She underwent MRCP earlier this morning, report is pending She says she feels much more comfortable currently after having pain medication in the ER and antiemetics.  She  is still sore in the right upper quadrant.  Hungry.  She asks about breast-feeding, still nursing her 34-year-old at nighttime and is concerned about ability to nurse with pain meds etc.  Currently trying to wean   Past Medical History:  Diagnosis Date   Bronchitis     Past Surgical History:  Procedure Laterality Date   CESAREAN SECTION      Prior to Admission medications   Medication Sig Start Date End Date Taking? Authorizing Provider  ibuprofen (ADVIL,MOTRIN) 200 MG tablet Take 600 mg by mouth once as needed for headache or moderate pain.   Yes [provider]  insulin glargine (LANTUS SOLOSTAR) 100 UNIT/ML Solostar Pen Start with 20 U s.q. nightly. 10/07/22  Yes Mliss Sax, MD  levonorgestrel [facility]) 20 MCG/24HR IUD 1 each by Intrauterine route once. Inserted 2011   Yes [provider]  metFORMIN (GLUCOPHAGE) 500 MG tablet Take 1 tablet (500 mg total) by mouth 2 (two) times daily. 03/08/23  Yes Mliss Sax, MD    Current Facility-Administered Medications  Medication Dose Route Frequency Provider Last Rate Last Admin   0.9 % NaCl with KCl 20 mEq/ L  infusion   Intravenous Continuous Opyd, Lavone Neri, MD 100 mL/hr at 05/27/23 0650 Infusion Verify at 05/27/23 0650   HYDROmorphone (DILAUDID) injection 0.5-1 mg  0.5-1 mg Intravenous Q4H PRN Opyd, Lavone Neri, MD       ibuprofen (ADVIL) tablet 400 mg  400 mg Oral Q6H PRN Opyd, Lavone Neri, MD       insulin aspart (novoLOG) injection 0-6 Units  0-6 Units Subcutaneous Q4H Opyd,  Lavone Neri, MD       ondansetron [facility]) tablet 4 mg  4 mg Oral Q6H PRN Opyd, Lavone Neri, MD       Or   ondansetron (ZOFRAN) injection 4 mg  4 mg Intravenous Q6H PRN Opyd, Lavone Neri, MD        Allergies as of 05/26/2023 - Review Complete 05/26/2023  Allergen Reaction Noted   Latex Itching and Other (See Comments) 03/31/2011   Flagyl [metronidazole hcl] Rash and Other (See Comments) 03/31/2011    History reviewed. No pertinent  family history.  Social History   Socioeconomic History   Marital status: Single    Spouse name: Not on file   Number of children: Not on file   Years of education: Not on file   Highest education level: Not on file  Occupational History   Not on file  Tobacco Use   Smoking status: Former   Smokeless tobacco: Never  Substance and Sexual Activity   Alcohol use: Yes   Drug use: No   Sexual activity: Not on file  Other Topics Concern   Not on file  Social History Narrative   Not on file   Social Determinants of Health   Financial Resource Strain: Not on file  Food Insecurity: No Food Insecurity (05/27/2023)   Hunger Vital Sign    Worried About Running Out of Food in the Last Year: Never true    Ran Out of Food in the Last Year: Never true  Transportation Needs: No Transportation Needs (05/27/2023)   PRAPARE - Administrator, Civil Service (Medical): No    Lack of Transportation (Non-Medical): No  Physical Activity: Not on file  Stress: Not on file  Social Connections: Not on file  Intimate Partner Violence: Not At Risk (05/27/2023)   Humiliation, Afraid, Rape, and Kick questionnaire    Fear of Current or Ex-Partner: No    Emotionally Abused: No    Physically Abused: No    Sexually Abused: No    Review of Systems: Pertinent positive and negative review of systems were noted in the above HPI section.  All other review of systems was otherwise negative.   Physical Exam: Vital signs in last 24 hours: Temp:  [97.6 F (36.4 C)-98.4 F (36.9 C)] 97.9 F (36.6 C) (09/19 1042) Pulse Rate:  [72-101] 72 (09/19 1042) Resp:  [16-20] 18 (09/19 0429) BP: (118-146)/(80-98) 122/81 (09/19 1042) SpO2:  [98 %-100 %] 100 % (09/19 1042) Weight:  [109.8 kg-118.8 kg] 118.4 kg (09/19 0429) Last BM Date : 05/26/23 General:   Alert,  Well-developed, well-nourished,obese AA female, very  pleasant and cooperative in NAD Head:  Normocephalic and atraumatic. Eyes:  Sclera with  early icteris  Conjunctiva pink. Ears:  Normal auditory acuity. Nose:  No deformity, discharge,  or lesions. Mouth:  No deformity or lesions.   Neck:  Supple; no masses or thyromegaly. Lungs:  Clear throughout to auscultation.   No wheezes, crackles, or rhonchi.  Heart:  Regular rate and rhythm; no murmurs, clicks, rubs,  or gallops. Abdomen:  obese Soft, he is tender in the right upper quadrant with some guarding, no rebound, BS active,nonpalp mass or hsm.   Rectal: Not done Msk:  Symmetrical without gross deformities. . Pulses:  Normal pulses noted. Extremities:  Without clubbing or edema. Neurologic:  Alert and  oriented x4;  grossly normal neurologically. Skin:  Intact without significant lesions or rashes.. Psych:  Alert and cooperative. Normal mood and affect.  Intake/Output  from previous day: 09/18 0701 - 09/19 0700 In: 656.2 [I.V.:175; IV Piggyback:481.2] Out: -  Intake/Output this shift: No intake/output data recorded.  Lab Results: Recent Labs    05/26/23 1447 05/26/23 1758 05/27/23 0449  WBC 5.6 5.9 5.8  HGB 13.4 13.6 12.3  HCT 41.7 41.7 38.9  PLT 317.0 325 304   BMET Recent Labs    05/26/23 1447 05/26/23 1758 05/27/23 0449  NA 138 136 136  K 3.6 3.3* 3.7  CL 100 99 101  CO2 30 25 25   GLUCOSE 116* 131* 123*  BUN 10 12 8   CREATININE 0.77 0.81 0.76  CALCIUM 9.2 8.9 8.9   LFT Recent Labs    05/27/23 0449  PROT 7.1  ALBUMIN 3.5  AST 120*  ALT 289*  ALKPHOS 142*  BILITOT 3.3*   PT/INR No results for input(s): "LABPROT", "INR" in the last 72 hours. Hepatitis Panel No results for input(s): "HEPBSAG", "HCVAB", "HEPAIGM", "HEPBIGM" in the last 72 hours.    IMPRESSION:  #59 32 year old African-American female with 70-month history of intermittent right upper quadrant pain radiating to the scapula and associated with nausea.  She had an episode about 5 days ago worse than all of the other previous episodes, persistent and associated with nausea  vomiting intermittent hot and cold spells and then darkening of her urine.  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  We reviewed biliary images, and I discussed ERCP and stone extraction in detail with patient including indications risks and benefits, all questions were answered.  She is agreeable to proceed if indicated. Also discussed potential need for surgical consultation for cholecystectomy  Will make her n.p.o. after midnight in anticipation of possible ERCP and stone extraction tomorrow afternoon with Dr. Russella Dar.  Will follow-up with patient after MRCP reviewed   Iya Hamed EsterwoodPA-C  05/27/2023, 10:44 AM

## 2023-05-28 ENCOUNTER — Encounter (HOSPITAL_COMMUNITY)
Admission: EM | Disposition: A | Discharge status: Home / Self Care | Payer: Self-pay | Source: Home / Self Care | Attending: Internal Medicine

## 2023-05-28 ENCOUNTER — Encounter (HOSPITAL_COMMUNITY): Payer: Self-pay | Admitting: Family Medicine

## 2023-05-28 ENCOUNTER — Inpatient Hospital Stay (HOSPITAL_COMMUNITY): Payer: Medicaid Other | Admitting: Registered Nurse

## 2023-05-28 ENCOUNTER — Inpatient Hospital Stay (HOSPITAL_COMMUNITY): Payer: Medicaid Other

## 2023-05-28 DIAGNOSIS — E119 Type 2 diabetes mellitus without complications: Secondary | ICD-10-CM

## 2023-05-28 DIAGNOSIS — K8051 Calculus of bile duct without cholangitis or cholecystitis with obstruction: Secondary | ICD-10-CM

## 2023-05-28 DIAGNOSIS — K831 Obstruction of bile duct: Secondary | ICD-10-CM | POA: Diagnosis not present

## 2023-05-28 DIAGNOSIS — R7989 Other specified abnormal findings of blood chemistry: Secondary | ICD-10-CM

## 2023-05-28 DIAGNOSIS — R933 Abnormal findings on diagnostic imaging of other parts of digestive tract: Secondary | ICD-10-CM

## 2023-05-28 DIAGNOSIS — R1011 Right upper quadrant pain: Secondary | ICD-10-CM

## 2023-05-28 HISTORY — PX: ERCP: SHX5425

## 2023-05-28 HISTORY — PX: REMOVAL OF STONES: SHX5545

## 2023-05-28 HISTORY — PX: SPHINCTEROTOMY: SHX5279

## 2023-05-28 LAB — COMPREHENSIVE METABOLIC PANEL
ALT: 257 U/L — ABNORMAL HIGH (ref 0–44)
AST: 120 U/L — ABNORMAL HIGH (ref 15–41)
Albumin: 3.3 g/dL — ABNORMAL LOW (ref 3.5–5.0)
Alkaline Phosphatase: 159 U/L — ABNORMAL HIGH (ref 38–126)
Anion gap: 8 (ref 5–15)
BUN: 5 mg/dL — ABNORMAL LOW (ref 6–20)
CO2: 23 mmol/L (ref 22–32)
Calcium: 8.4 mg/dL — ABNORMAL LOW (ref 8.9–10.3)
Chloride: 105 mmol/L (ref 98–111)
Creatinine, Ser: 0.73 mg/dL (ref 0.44–1.00)
GFR, Estimated: >60 mL/min (ref >60)
Glucose, Bld: 125 mg/dL — ABNORMAL HIGH (ref 70–99)
Potassium: 3.6 mmol/L (ref 3.5–5.1)
Sodium: 136 mmol/L (ref 135–145)
Total Bilirubin: 4.7 mg/dL — ABNORMAL HIGH (ref 0.3–1.2)
Total Protein: 7 g/dL (ref 6.5–8.1)

## 2023-05-28 LAB — GLUCOSE, CAPILLARY
Glucose-Capillary: 119 mg/dL — ABNORMAL HIGH (ref 70–99)
Glucose-Capillary: 123 mg/dL — ABNORMAL HIGH (ref 70–99)
Glucose-Capillary: 105 mg/dL — ABNORMAL HIGH (ref 70–99)
Glucose-Capillary: 110 mg/dL — ABNORMAL HIGH (ref 70–99)
Glucose-Capillary: 121 mg/dL — ABNORMAL HIGH (ref 70–99)
Glucose-Capillary: 162 mg/dL — ABNORMAL HIGH (ref 70–99)

## 2023-05-28 LAB — HEMOGLOBIN A1C
Hgb A1c MFr Bld: 7.9 % — ABNORMAL HIGH (ref 4.8–5.6)
Mean Plasma Glucose: 180.03 mg/dL

## 2023-05-28 SURGERY — ERCP, WITH INTERVENTION IF INDICATED
Anesthesia: General

## 2023-05-28 MED ORDER — LIDOCAINE 2% (20 MG/ML) 5 ML SYRINGE
INTRAMUSCULAR | Status: DC | PRN
  Administered 2023-05-28: 100 mg via INTRAVENOUS

## 2023-05-28 MED ORDER — CIPROFLOXACIN IN D5W 400 MG/200ML IV SOLN
INTRAVENOUS | Status: AC
  Filled 2023-05-28: qty 200

## 2023-05-28 MED ORDER — GLUCAGON HCL RDNA (DIAGNOSTIC) 1 MG IJ SOLR
INTRAMUSCULAR | Status: DC | PRN
Start: 2023-05-28 — End: 2023-05-28
  Administered 2023-05-28 (×2): .25 mg via INTRAVENOUS

## 2023-05-28 MED ORDER — DEXAMETHASONE SODIUM PHOSPHATE 10 MG/ML IJ SOLN
INTRAMUSCULAR | Status: DC | PRN
  Administered 2023-05-28: 5 mg via INTRAVENOUS

## 2023-05-28 MED ORDER — LACTATED RINGERS IV SOLN: INTRAVENOUS | Status: DC

## 2023-05-28 MED ORDER — SODIUM CHLORIDE 0.9 % IV SOLN
INTRAVENOUS | Status: AC
  Filled 2023-05-28: qty 8

## 2023-05-28 MED ORDER — DICLOFENAC SUPPOSITORY 100 MG
RECTAL | Status: AC
  Filled 2023-05-28: qty 1

## 2023-05-28 MED ORDER — SODIUM CHLORIDE 0.9 % IV SOLN
3.0000 g | Freq: Once | INTRAVENOUS | Status: AC
  Administered 2023-05-28: 3 g via INTRAVENOUS

## 2023-05-28 MED ORDER — SODIUM CHLORIDE 0.9 % IV SOLN
INTRAVENOUS | Status: DC | PRN
  Administered 2023-05-28: 30 mL

## 2023-05-28 MED ORDER — FENTANYL CITRATE (PF) 100 MCG/2ML IJ SOLN
INTRAMUSCULAR | Status: AC
  Filled 2023-05-28: qty 2

## 2023-05-28 MED ORDER — SODIUM CHLORIDE 0.9 % IV SOLN
2.0000 g | INTRAVENOUS | Status: AC
  Filled 2023-05-28: qty 20

## 2023-05-28 MED ORDER — PROPOFOL 10 MG/ML IV BOLUS
INTRAVENOUS | Status: DC | PRN
  Administered 2023-05-28: 200 mg via INTRAVENOUS

## 2023-05-28 MED ORDER — DICLOFENAC SUPPOSITORY 100 MG
RECTAL | Status: DC | PRN
  Administered 2023-05-28: 100 mg via RECTAL

## 2023-05-28 MED ORDER — GLUCAGON HCL RDNA (DIAGNOSTIC) 1 MG IJ SOLR
INTRAMUSCULAR | Status: AC
  Filled 2023-05-28: qty 1

## 2023-05-28 MED ORDER — ONDANSETRON HCL 4 MG/2ML IJ SOLN
INTRAMUSCULAR | Status: DC | PRN
  Administered 2023-05-28: 4 mg via INTRAVENOUS

## 2023-05-28 MED ORDER — ROCURONIUM BROMIDE 10 MG/ML (PF) SYRINGE
PREFILLED_SYRINGE | INTRAVENOUS | Status: DC | PRN
  Administered 2023-05-28: 70 mg via INTRAVENOUS

## 2023-05-28 MED ORDER — FENTANYL CITRATE (PF) 250 MCG/5ML IJ SOLN
INTRAMUSCULAR | Status: DC | PRN
  Administered 2023-05-28: 100 ug via INTRAVENOUS

## 2023-05-28 MED ORDER — SUGAMMADEX SODIUM 200 MG/2ML IV SOLN
INTRAVENOUS | Status: DC | PRN
  Administered 2023-05-28: 400 mg via INTRAVENOUS

## 2023-05-28 NOTE — Op Note (Signed)
[facility] Patient Name: Brandy Garza Procedure Date : 05/28/2023 MRN: [id number] Attending MD: Meryl Dare , MD, [phone] Date of Birth: 04-03-1991 CSN: [id number] Age: 32 Admit Type: Inpatient Procedure:                ERCP Indications:              Abdominal pain of suspected biliary origin,                            Abnormal MRCP, Elevated liver enzymes Providers:                Venita Lick. Russella Dar, MD, Doristine Mango, RN, Marja Kays, Technician Referring MD:             [facility] Medicines:                General Anesthesia Complications:            No immediate complications. Estimated Blood Loss:     Estimated blood loss: none. Procedure:                Pre-Anesthesia Assessment:                           - Prior to the procedure, a History and Physical                            was performed, and patient medications and                            allergies were reviewed. The patient's tolerance of                            previous anesthesia was also reviewed. The risks                            and benefits of the procedure and the sedation                            options and risks were discussed with the patient.                            All questions were answered, and informed consent                            was obtained. Prior Anticoagulants: The patient has                            taken no anticoagulant or antiplatelet agents. ASA                            Grade Assessment: II - A patient with mild systemic  disease. After reviewing the risks and benefits,                            the patient was deemed in satisfactory condition to                            undergo the procedure.                           After obtaining informed consent, the scope was                            passed under direct vision. Throughout the                            procedure, the patient's blood  pressure, pulse, and                            oxygen saturations were monitored continuously. The                            [id number]-Q190V ([id number] Olympus duodenoscope was                            introduced through the mouth, and used to inject                            contrast into and used to inject contrast into the                            bile duct. The ERCP was accomplished without                            difficulty. The patient tolerated the procedure                            well. Scope In: Scope Out: Findings:      The scout film was normal. The scope was advanced to a normal major       papilla in the descending duodenum. Very limited examination of the       pharynx, larynx and associated structures, and upper GI tract was       normal. A straight Roadrunner wire was passed into the biliary tree       after one pass briefly into the PD. The short-nosed traction       sphincterotome was passed over the guidewire and the bile duct was then       deeply cannulated. Wire Contrast was injected. I personally interpreted       the bile duct images. Ductal flow of contrast was adequate. The common       bile duct was moderately dilated and diffusely dilated, with a stone       causing an obstruction. The largest diameter was 11 mm. The sphincter       was longer than average. A 10 mm biliary sphincterotomy was made with a  traction (standard) sphincterotome using ERBE electrocautery. There was       no post-sphincterotomy bleeding. The biliary tree was swept with a 12 mm       balloon starting at the bifurcation. Sludge was swept from the duct. One       stone was removed. No stones remained. Very good biliary drainage was       then noted. The PD was not injected by intention. Impression:               - The major papilla appeared normal.                           - The common bile duct was moderately dilated, with                            a stone and sludge  causing an obstruction.                           - Choledocholithiasis and sludge was found.                            Complete removal was accomplished by biliary                            sphincterotomy and balloon extraction. Recommendation:           - Return patient to hospital ward for ongoing care.                           - Observe patient's clinical course following                            today's ERCP with therapeutic intervention.                           - Clear liquid diet this afternoon then advance as                            tolerated                           - Trend LFTs.                           - Surgical consult - consider cholecystectomy.                           - Outpatient GI follow up prn with Dr. Tomasa Rand. Procedure Code(s):        --- Professional ---                           [phone], Endoscopic retrograde                            cholangiopancreatography (ERCP); with removal of  calculi/debris from biliary/pancreatic duct(s)                           [phone], Endoscopic retrograde                            cholangiopancreatography (ERCP); with                            sphincterotomy/papillotomy Diagnosis Code(s):        --- Professional ---                           K80.51, Calculus of bile duct without cholangitis                            or cholecystitis with obstruction                           R10.9, Unspecified abdominal pain                           R74.8, Abnormal levels of other serum enzymes                           R93.2, Abnormal findings on diagnostic imaging of                            liver and biliary tract CPT copyright 2022 American Medical Association. All rights reserved. The codes documented in this report are preliminary and upon coder review may  be revised to meet current compliance requirements. Meryl Dare, MD 05/28/2023 1:20:32 PM This report has been signed  electronically. Number of Addenda: 0

## 2023-05-28 NOTE — Transfer of Care (Signed)
Immediate Anesthesia Transfer of Care Note  Patient: Brandy Garza  Procedure(s) Performed: ENDOSCOPIC RETROGRADE CHOLANGIOPANCREATOGRAPHY (ERCP) SPHINCTEROTOMY REMOVAL OF STONES  Patient Location: PACU  Anesthesia Type:General  Level of Consciousness: awake, alert , and oriented  Airway & Oxygen Therapy: Patient Spontanous Breathing  Post-op Assessment: Report given to RN, Post -op Vital signs reviewed and stable, and Patient moving all extremities X 4  Post vital signs: Reviewed and stable  Last Vitals:  Vitals Value Taken Time  BP 140/88 05/28/23 1320  Temp    Pulse 77 05/28/23 1320  Resp 9 05/28/23 1320  SpO2 98 % 05/28/23 1320  Vitals shown include unfiled device data.  Last Pain:  Vitals:   05/28/23 1125  TempSrc: Temporal  PainSc: 0-No pain      Patients Stated Pain Goal: 0 (05/28/23 8295)  Complications: There were no known notable events for this encounter.

## 2023-05-28 NOTE — Plan of Care (Signed)

## 2023-05-28 NOTE — Interval H&P Note (Signed)
History and Physical Interval Note:  05/28/2023 12:17 PM  Brandy Garza  has presented today for surgery, with the diagnosis of CBD dilation, abdominal pain, N/V.  The various methods of treatment have been discussed with the patient and family. After consideration of risks, benefits and other options for treatment, the patient has consented to  Procedure(s): ENDOSCOPIC RETROGRADE CHOLANGIOPANCREATOGRAPHY (ERCP) (N/A) as a surgical intervention.  The patient's history has been reviewed, patient examined, no change in status, stable for surgery.  I have reviewed the patient's chart and labs.  Questions were answered to the patient's satisfaction.     Venita Lick. Russella Dar

## 2023-05-28 NOTE — Anesthesia Preprocedure Evaluation (Signed)
Anesthesia Evaluation  Patient identified by MRN, date of birth, ID band Patient awake    Reviewed: Allergy & Precautions, H&P , NPO status , Patient's Chart, lab work & pertinent test results  Airway Mallampati: II  TM Distance: <3 FB Neck ROM: Full    Dental no notable dental hx.    Pulmonary neg pulmonary ROS, former smoker   Pulmonary exam normal breath sounds clear to auscultation       Cardiovascular negative cardio ROS Normal cardiovascular exam Rhythm:Regular Rate:Normal     Neuro/Psych negative neurological ROS  negative psych ROS   GI/Hepatic negative GI ROS, Neg liver ROS,,,  Endo/Other  diabetes, Poorly Controlled, Type 2  Morbid obesity  Renal/GU negative Renal ROS  negative genitourinary   Musculoskeletal negative musculoskeletal ROS (+)    Abdominal   Peds negative pediatric ROS (+)  Hematology negative hematology ROS (+)   Anesthesia Other Findings   Reproductive/Obstetrics negative OB ROS                             Anesthesia Physical Anesthesia Plan  ASA: 3  Anesthesia Plan: General   Post-op Pain Management: Minimal or no pain anticipated   Induction: Intravenous  PONV Risk Score and Plan: Ondansetron, Dexamethasone and Treatment may vary due to age or medical condition  Airway Management Planned: Oral ETT  Additional Equipment:   Intra-op Plan:   Post-operative Plan: Extubation in OR  Informed Consent: I have reviewed the patients History and Physical, chart, labs and discussed the procedure including the risks, benefits and alternatives for the proposed anesthesia with the patient or authorized representative who has indicated his/her understanding and acceptance.     Dental advisory given  Plan Discussed with: CRNA and Surgeon  Anesthesia Plan Comments:        Anesthesia Quick Evaluation

## 2023-05-28 NOTE — Anesthesia Procedure Notes (Addendum)
Procedure Name: Intubation Date/Time: 05/28/2023 12:25 PM  Performed by: Sharyn Dross, CRNAPre-anesthesia Checklist: Patient identified, Emergency Drugs available, Suction available and Patient being monitored Patient Re-evaluated:Patient Re-evaluated prior to induction Oxygen Delivery Method: Circle system utilized Preoxygenation: Pre-oxygenation with 100% oxygen Induction Type: IV induction Ventilation: Mask ventilation without difficulty Laryngoscope Size: Mac and 4 Grade View: Grade I Tube type: Oral Tube size: 7.0 mm Number of attempts: 1 Airway Equipment and Method: Stylet and Oral airway Placement Confirmation: ETT inserted through vocal cords under direct vision, positive ETCO2 and breath sounds checked- equal and bilateral Secured at: 22 cm Tube secured with: Tape Dental Injury: Teeth and Oropharynx as per pre-operative assessment

## 2023-05-28 NOTE — Progress Notes (Signed)
PROGRESS NOTE  Brandy Garza  ZOX:[id number] DOB: Dec 23, 1990 DOA: 05/26/2023 PCP: Mliss Sax, MD   Brief Narrative: Patient is a 32 year old female with history of type 2 diabetes, morbid obesity who presented with right upper quadrant pain, nausea, vomiting, diarrhea.  On presentation she was hemodynamically stable.  Lab work showed elevated liver enzymes, elevated bilirubin.  No leukocytosis or elevated lipase.  Right upper quadrant ultrasound showed dilated CBD of 13 mm , positive sonographic Murphy sign without gallstones or gallbladder wall thickening or pericholecystic fluid.  GI consulted.  MRCP showed intrahepatic, extrahepatic biliary duct dilation with tapering at distal CBD.  Patient underwent ERCP today, found to have a stone and sludge causing obstruction, moderate dilation of CBD s/p biliary sphincterotomy and balloon extraction.  General surgery being consulted for consideration of cholecystectomy.  Assessment & Plan:  Principal Problem:   Biliary obstruction Active Problems:   Type 2 diabetes mellitus with hyperglycemia, without long-term current use of insulin (HCC)   Abnormal magnetic resonance cholangiopancreatography (MRCP)   Elevated LFTs   RUQ abdominal pain   Abdominal pain/biliary obstruction: Presented with right upper quadrant pain, nausea, vomiting.  Lab work showed elevated liver enzymes, elevated bilirubin.  No leukocytosis or elevated lipase.  Right upper quadrant ultrasound showed 13 mm CBD, positive sonographic Murphy sign without gallstones or gallbladder wall thickening or pericholecystic fluid.  GI consulted.  Patient underwent ERCP today, found to have a stone and sludge causing obstruction, moderate dilation of CBD s/p biliary sphincterotomy and balloon extraction.  General surgery being consulted for consideration of cholecystectomy.  Type 2 diabetes: Recent A1c of 8.1.  Currently on sliding scale  insulin.  Morbid obesity: BMI 41.5         DVT prophylaxis:SCDs Start: 05/27/23 0441     Code Status: Full Code  Family Communication: None at bedside  Patient status:Inpatient  Patient is from :home  Anticipated discharge WJ:XBJY  Estimated DC date:after surgical evaluation and plan, possible cholecystectomy   Consultants: GI, general surgery  Procedures: ERCP  Antimicrobials:  Anti-infectives (From admission, onward)    Start     Dose/Rate Route Frequency Ordered Stop   05/28/23 1015  [MAR Hold]  Ampicillin-Sulbactam (UNASYN) 3 g in sodium chloride 0.9 % 100 mL IVPB        (MAR Hold since Fri 05/28/2023 at 1124.Hold Reason: Transfer to a Procedural area)   3 g 200 mL/hr over 30 Minutes Intravenous  Once 05/28/23 0920 05/28/23 1256       Subjective: Seen and examined at bedside.  Remains comfortable.  Talking on phone.  She complains of some discomfort in the right upper quadrant today.  No nausea or vomiting  Objective: Vitals:   05/28/23 0724 05/28/23 1125 05/28/23 1320 05/28/23 1330  BP: (!) 97/59 135/86 (!) 140/88 120/77  Pulse: 77 77 82 71  Resp: 16 14 10 20   Temp: 98.1 F (36.7 C) (!) 97 F (36.1 C) (!) 97.5 F (36.4 C)   TempSrc: Oral Temporal Temporal   SpO2: 100% 99% 96% 98%  Weight:      Height:        Intake/Output Summary (Last 24 hours) at 05/28/2023 1337 Last data filed at 05/28/2023 1306 Gross per 24 hour  Intake 600 ml  Output 0 ml  Net 600 ml   Filed Weights   05/26/23 1752 05/27/23 0429  Weight: 109.8 kg 118.4 kg    Examination:  General exam: Overall comfortable, not in distress,obese HEENT: PERRL Respiratory system:  no  wheezes or crackles  Cardiovascular system: S1 & S2 heard, RRR.  Gastrointestinal system: Abdomen is nondistended, soft and nontender. Central nervous system: Alert and oriented Extremities: No edema, no clubbing ,no cyanosis Skin: No rashes, no ulcers,no icterus    Data Reviewed: I have personally reviewed following labs and imaging  studies  CBC: Recent Labs  Lab 05/26/23 1447 05/26/23 1758 05/27/23 0449  WBC 5.6 5.9 5.8  NEUTROABS 3.9 4.3  --   HGB 13.4 13.6 12.3  HCT 41.7 41.7 38.9  MCV 79.9 79.9* 80.2  PLT 317.0 325 304   Basic Metabolic Panel: Recent Labs  Lab 05/26/23 1447 05/26/23 1758 05/27/23 0449 05/28/23 1036  NA 138 136 136 136  K 3.6 3.3* 3.7 3.6  CL 100 99 101 105  CO2 30 25 25 23   GLUCOSE 116* 131* 123* 125*  BUN 10 12 8  5*  CREATININE 0.77 0.81 0.76 0.73  CALCIUM 9.2 8.9 8.9 8.4*  MG  --   --  2.3  --      No results found for this or any previous visit (from the past 240 hour(s)).   Radiology Studies: MR ABDOMEN MRCP W WO CONTAST  Result Date: 05/27/2023 CLINICAL DATA:  Jaundice.  Abdominal pain EXAM: MRI ABDOMEN WITHOUT AND WITH CONTRAST (INCLUDING MRCP) TECHNIQUE: Multiplanar multisequence MR imaging of the abdomen was performed both before and after the administration of intravenous contrast. Heavily T2-weighted images of the biliary and pancreatic ducts were obtained, and three-dimensional MRCP images were rendered by post processing. CONTRAST:  10mL GADAVIST GADOBUTROL 1 MMOL/ML IV SOLN COMPARISON:  Yesterday's ultrasound FINDINGS: Lower chest: Normal heart size without pericardial or pleural effusion. Hepatobiliary: No suspicious liver lesion.  Normal gallbladder. Moderate intrahepatic biliary duct dilatation with the left hepatic duct measuring 7 mm on 15/3. The common duct is also moderately dilated, including at 12 mm on 25/2. Tapers in the region of the pancreatic head, just proximal to the ampulla on 23/2. No underlying stone or mass identified. Pancreas:  Normal, without mass or ductal dilatation. Spleen:  Normal in size, without focal abnormality. Adrenals/Urinary Tract: Normal adrenal glands. Normal kidneys, without hydronephrosis. Stomach/Bowel: Normal stomach and abdominal bowel loops. Vascular/Lymphatic: Normal caliber of the aorta and branch vessels. No retroperitoneal or  retrocrural adenopathy. Other:  No ascites. Musculoskeletal: No acute osseous abnormality. IMPRESSION: Moderate intra and extrahepatic biliary duct dilatation, with tapering of the distal common duct just above the ampulla. No underlying stone or mass identified. Consider further evaluation with ERCP to confirm benign stricture and exclude unlikely occult ampullary or periampullary lesion. Electronically Signed   By: Jeronimo Greaves M.D.   On: 05/27/2023 14:08   MR 3D Recon At Scanner  Result Date: 05/27/2023 CLINICAL DATA:  Jaundice.  Abdominal pain EXAM: MRI ABDOMEN WITHOUT AND WITH CONTRAST (INCLUDING MRCP) TECHNIQUE: Multiplanar multisequence MR imaging of the abdomen was performed both before and after the administration of intravenous contrast. Heavily T2-weighted images of the biliary and pancreatic ducts were obtained, and three-dimensional MRCP images were rendered by post processing. CONTRAST:  10mL GADAVIST GADOBUTROL 1 MMOL/ML IV SOLN COMPARISON:  Yesterday's ultrasound FINDINGS: Lower chest: Normal heart size without pericardial or pleural effusion. Hepatobiliary: No suspicious liver lesion.  Normal gallbladder. Moderate intrahepatic biliary duct dilatation with the left hepatic duct measuring 7 mm on 15/3. The common duct is also moderately dilated, including at 12 mm on 25/2. Tapers in the region of the pancreatic head, just proximal to the ampulla on 23/2. No underlying stone or  mass identified. Pancreas:  Normal, without mass or ductal dilatation. Spleen:  Normal in size, without focal abnormality. Adrenals/Urinary Tract: Normal adrenal glands. Normal kidneys, without hydronephrosis. Stomach/Bowel: Normal stomach and abdominal bowel loops. Vascular/Lymphatic: Normal caliber of the aorta and branch vessels. No retroperitoneal or retrocrural adenopathy. Other:  No ascites. Musculoskeletal: No acute osseous abnormality. IMPRESSION: Moderate intra and extrahepatic biliary duct dilatation, with tapering  of the distal common duct just above the ampulla. No underlying stone or mass identified. Consider further evaluation with ERCP to confirm benign stricture and exclude unlikely occult ampullary or periampullary lesion. Electronically Signed   By: Jeronimo Greaves M.D.   On: 05/27/2023 14:08   US Abdomen Limited RUQ (LIVER/GB)  Result Date: 05/26/2023 CLINICAL DATA:  Right upper quadrant pain and elevated liver function tests. EXAM: ULTRASOUND ABDOMEN LIMITED RIGHT UPPER QUADRANT COMPARISON:  None Available. FINDINGS: Gallbladder: The gallbladder is mildly distended. No gallstones or wall thickening visualized (1.5 mm). A positive sonographic Eulah Pont sign is noted by the sonographer. Common bile duct: Diameter: 13.3 mm Liver: A 0.6 cm x 1.2 cm x 1.2 cm area of hypoechogenicity is seen within the left lobe of the liver. Diffusely increased echogenicity of the liver parenchyma is noted. Mild, central intrahepatic biliary dilatation is seen. Portal vein is patent on color Doppler imaging with normal direction of blood flow towards the liver. Other: None. IMPRESSION: 1. Positive sonographic Murphy sign, in the absence of gallstones, gallbladder wall thickening or pericholecystic fluid. Further evaluation with a nuclear medicine hepatobiliary scan is recommended if acalculous cholecystitis is of clinical concern. 2. Common bile duct dilatation without visualization of an obstructing lesion. Further evaluation with MRCP is recommended. 3. Hepatic steatosis with a small hepatic cyst versus hemangioma suspected within the left lobe of the liver. Correlation with nonemergent hepatic MRI is recommended. Electronically Signed   By: Aram Candela M.D.   On: 05/26/2023 19:05    Scheduled Meds:  [MAR Hold] insulin aspart  0-6 Units Subcutaneous Q4H   Continuous Infusions:  lactated ringers 10 mL/hr at 05/28/23 1123     LOS: 1 day   Burnadette Pop, MD [facility] P9/20/2024, 1:37 PM

## 2023-05-28 NOTE — Anesthesia Postprocedure Evaluation (Signed)
Anesthesia Post Note  Patient: Occupational psychologist  Procedure(s) Performed: ENDOSCOPIC RETROGRADE CHOLANGIOPANCREATOGRAPHY (ERCP) SPHINCTEROTOMY REMOVAL OF STONES     Patient location during evaluation: PACU Anesthesia Type: General Level of consciousness: awake and alert Pain management: pain level controlled Vital Signs Assessment: post-procedure vital signs reviewed and stable Respiratory status: spontaneous breathing, nonlabored ventilation, respiratory function stable and patient connected to nasal cannula oxygen Cardiovascular status: blood pressure returned to baseline and stable Postop Assessment: no apparent nausea or vomiting Anesthetic complications: no  There were no known notable events for this encounter.  Last Vitals:  Vitals:   05/28/23 1125 05/28/23 1320  BP: 135/86 (!) 140/88  Pulse: 77 82  Resp: 14 10  Temp: (!) 36.1 C (!) 36.4 C  SpO2: 99% 96%    Last Pain:  Vitals:   05/28/23 1320  TempSrc: Temporal  PainSc: 0-No pain                 Timo Hartwig S

## 2023-05-28 NOTE — Progress Notes (Signed)
Brandy Garza 10-01-1990  [id number].    Requesting MD: Renford Dills, MD Chief Complaint/Reason for Consult: choledocholithiasis   HPI:  Brandy Garza is a 32 y/o F with a history of obestiy, diabetes, and c-section who presented 9/18 with abdominal pain. She tells me that she has had 3 months of intermittent RUQ pain that radiates to her back. The pain is temporarily relieved by gas meds and occurs about 3-4 times monthly. On Sunday 9/15 she developed constant, worsening RUQ pain abdominal pain associated with nausea, vomiting, and diarrhea. Workup in ED significant for elevated LFTS with a t.bili of  3.3 and MRCP showing intra and extra hepatic biliary dilation with tapering of the distal CBD, no visible stone. She underwent ERCP today where choledocholithiasis was found and successful sphincterotomy and balloon extraction was performed.   Past surgeries: c-section x 40 (has a 49 year-old daughter and 4 year-old son) Blood thinners: none Allergies: latex (itching), flagyl (hives) Social history: states she is a former smoker who quit 2-3 years ago, while pregnant with her son. Denies heavy alcohol or other drug use. She currently works from home for [employer] in [employer]. She lives at home with her kids and her spouse.   ROS: Review of Systems  All other systems reviewed and are negative.   History reviewed. No pertinent family history.  Past Medical History:  Diagnosis Date   Bronchitis     Past Surgical History:  Procedure Laterality Date   CESAREAN SECTION      Social History:  reports that she has quit smoking. She has never used smokeless tobacco. She reports current alcohol use. She reports that she does not use drugs.  Allergies:  Allergies  Allergen Reactions   Latex Itching and Other (See Comments)    burn   Flagyl [Metronidazole Hcl] Rash and Other (See Comments)    Bumps all over     Medications Prior to Admission  Medication Sig Dispense  Refill   ibuprofen (ADVIL,MOTRIN) 200 MG tablet Take 600 mg by mouth once as needed for headache or moderate pain.     insulin glargine (LANTUS SOLOSTAR) 100 UNIT/ML Solostar Pen Start with 20 U s.q. nightly. 15 mL 11   levonorgestrel (MIRENA) 20 MCG/24HR IUD 1 each by Intrauterine route once. Inserted 2011     metFORMIN (GLUCOPHAGE) 500 MG tablet Take 1 tablet (500 mg total) by mouth 2 (two) times daily. 180 tablet 0     Physical Exam: Blood pressure 118/77, pulse 69, temperature 98.5 F (36.9 C), temperature source Oral, resp. rate 16, height 5' 6.5" (1.689 m), weight 118.4 kg, SpO2 99%. General: Pleasant female laying on hospital bed, appears stated age, NAD. HEENT: head -normocephalic, atraumatic; Eyes: PERRLA, no conjunctival injection; mild upper lip swelling after endoscopy  Neck- Trachea is midline CV- RRR, normal S1/S2, no M/R/G, no lower extremity edema  Pulm- breathing is non-labored ORA Abd- soft, NT/ND, no palpable masses or hernias GU- deferred  MSK- UE/LE symmetrical, no cyanosis, clubbing, or edema. Neuro- CN II-XII grossly in tact, no paresthesias. Psych- Alert and Oriented x3 with appropriate affect Skin: warm and dry, no rashes or lesions   Results for orders placed or performed during the hospital encounter of 05/26/23 (from the past 48 hour(s))  CBC with Differential     Status: Abnormal   Collection Time: 05/26/23  5:58 PM  Result Value Ref Range   WBC 5.9 4.0 - 10.5 K/uL   RBC 5.22 (H) 3.87 - 5.11  MIL/uL   Hemoglobin 13.6 12.0 - 15.0 g/dL   HCT 16.1 09.6 - 04.5 %   MCV 79.9 (L) 80.0 - 100.0 fL   MCH 26.1 26.0 - 34.0 pg   MCHC 32.6 30.0 - 36.0 g/dL   RDW 40.9 81.1 - 91.4 %   Platelets 325 150 - 400 K/uL   nRBC 0.0 0.0 - 0.2 %   Neutrophils Relative % 73 %   Neutro Abs 4.3 1.7 - 7.7 K/uL   Lymphocytes Relative 16 %   Lymphs Abs 1.0 0.7 - 4.0 K/uL   Monocytes Relative 10 %   Monocytes Absolute 0.6 0.1 - 1.0 K/uL   Eosinophils Relative 1 %   Eosinophils  Absolute 0.0 0.0 - 0.5 K/uL   Basophils Relative 0 %   Basophils Absolute 0.0 0.0 - 0.1 K/uL   Immature Granulocytes 0 %   Abs Immature Granulocytes 0.01 0.00 - 0.07 K/uL    Comment: Performed at [facility], [address] [place], [place] [address]  Comprehensive metabolic panel     Status: Abnormal   Collection Time: 05/26/23  5:58 PM  Result Value Ref Range   Sodium 136 135 - 145 mmol/L   Potassium 3.3 (L) 3.5 - 5.1 mmol/L   Chloride 99 98 - 111 mmol/L   CO2 25 22 - 32 mmol/L   Glucose, Bld 131 (H) 70 - 99 mg/dL    Comment: Glucose reference range applies only to samples taken after fasting for at least 8 hours.   BUN 12 6 - 20 mg/dL   Creatinine, Ser 6.21 0.44 - 1.00 mg/dL   Calcium 8.9 8.9 - 30.8 mg/dL   Total Protein 8.4 (H) 6.5 - 8.1 g/dL   Albumin 4.2 3.5 - 5.0 g/dL   AST 657 (H) 15 - 41 U/L   ALT 337 (H) 0 - 44 U/L   Alkaline Phosphatase 164 (H) 38 - 126 U/L   Total Bilirubin 3.3 (H) 0.3 - 1.2 mg/dL   GFR, Estimated >84 >69 mL/min    Comment: (NOTE) Calculated using the CKD-EPI Creatinine Equation (2021)    Anion gap 12 5 - 15    Comment: Performed at [facility], [address] [place], [place] [address]  Lipase, blood     Status: None   Collection Time: 05/26/23  5:58 PM  Result Value Ref Range   Lipase 22 11 - 51 U/L    Comment: Performed at [facility], [address] [place], [place] [address]  Urinalysis, Routine w reflex microscopic -Urine, Clean Catch     Status: Abnormal   Collection Time: 05/26/23  5:58 PM  Result Value Ref Range   Color, Urine YELLOW YELLOW   APPearance HAZY (A) CLEAR   Specific Gravity, Urine 1.025 1.005 - 1.030   pH 5.5 5.0 - 8.0   Glucose, UA 100 (A) NEGATIVE mg/dL   Hgb urine dipstick SMALL (A) NEGATIVE   Bilirubin Urine LARGE (A) NEGATIVE   Ketones, ur 15 (A) NEGATIVE mg/dL   Protein, ur 30 (A) NEGATIVE mg/dL   Nitrite NEGATIVE NEGATIVE   Leukocytes,Ua NEGATIVE NEGATIVE    Comment:  Performed at [facility], [address] [place], [place] [address]  Pregnancy, urine     Status: None   Collection Time: 05/26/23  5:58 PM  Result Value Ref Range   Preg Test, Ur NEGATIVE NEGATIVE    Comment:  THE SENSITIVITY OF THIS METHODOLOGY IS >25 mIU/mL. Performed at [facility], [address] [place], [place] [address]   Urinalysis, Microscopic (reflex)     Status: Abnormal   Collection Time: 05/26/23  5:58 PM  Result Value Ref Range   RBC / HPF 6-10 0 - 5 RBC/hpf   WBC, UA NONE SEEN 0 - 5 WBC/hpf   Bacteria, UA FEW (A) NONE SEEN   Squamous Epithelial / HPF 6-10 0 - 5 /HPF    Comment: Performed at [facility], [address] [place], [place] [address]  Glucose, capillary     Status: Abnormal   Collection Time: 05/27/23  4:28 AM  Result Value Ref Range   Glucose-Capillary 132 (H) 70 - 99 mg/dL    Comment: Glucose reference range applies only to samples taken after fasting for at least 8 hours.  HIV Antibody (routine testing w rflx)     Status: None   Collection Time: 05/27/23  4:49 AM  Result Value Ref Range   HIV Screen 4th Generation wRfx Non Reactive Non Reactive    Comment: Performed at [facility], [address], [place], [place] [address]  Comprehensive metabolic panel     Status: Abnormal   Collection Time: 05/27/23  4:49 AM  Result Value Ref Range   Sodium 136 135 - 145 mmol/L   Potassium 3.7 3.5 - 5.1 mmol/L   Chloride 101 98 - 111 mmol/L   CO2 25 22 - 32 mmol/L   Glucose, Bld 123 (H) 70 - 99 mg/dL    Comment: Glucose reference range applies only to samples taken after fasting for at least 8 hours.   BUN 8 6 - 20 mg/dL   Creatinine, Ser 6.29 0.44 - 1.00 mg/dL   Calcium 8.9 8.9 - 52.8 mg/dL   Total Protein 7.1 6.5 - 8.1 g/dL   Albumin 3.5 3.5 - 5.0 g/dL   AST 413 (H) 15 - 41 U/L   ALT 289 (H) 0 - 44 U/L   Alkaline Phosphatase 142 (H) 38 - 126 U/L   Total Bilirubin 3.3 (H) 0.3 - 1.2 mg/dL   GFR,  Estimated >24 >40 mL/min    Comment: (NOTE) Calculated using the CKD-EPI Creatinine Equation (2021)    Anion gap 10 5 - 15    Comment: Performed at [facility], [address], [place], [place] [address]  Magnesium     Status: None   Collection Time: 05/27/23  4:49 AM  Result Value Ref Range   Magnesium 2.3 1.7 - 2.4 mg/dL    Comment: Performed at [facility], [address] [place], [place] [address]  CBC     Status: Abnormal   Collection Time: 05/27/23  4:49 AM  Result Value Ref Range   WBC 5.8 4.0 - 10.5 K/uL   RBC 4.85 3.87 - 5.11 MIL/uL   Hemoglobin 12.3 12.0 - 15.0 g/dL   HCT 40.3 47.4 - 25.9 %   MCV 80.2 80.0 - 100.0 fL   MCH 25.4 (L) 26.0 - 34.0 pg   MCHC 31.6 30.0 - 36.0 g/dL   RDW 56.3 87.5 - 64.3 %   Platelets 304 150 - 400 K/uL   nRBC 0.0 0.0 - 0.2 %    Comment: Performed at [facility], [address] [place], [place] [address]  Glucose, capillary     Status: Abnormal   Collection Time: 05/27/23  7:28 AM  Result Value Ref Range   Glucose-Capillary 115 (H) 70 - 99 mg/dL    Comment: Glucose reference range applies only to samples taken after fasting for at least 8 hours.  Protime-INR     Status: None   Collection Time: 05/27/23 11:06 AM  Result Value Ref Range   Prothrombin Time 13.5 11.4 - 15.2 seconds   INR 1.0 0.8 - 1.2    Comment: (NOTE) INR goal varies based on device and disease states. Performed at [facility], [address] [place], [place] [address]   Glucose, capillary     Status: Abnormal   Collection Time: 05/27/23 11:29 AM  Result Value Ref Range   Glucose-Capillary 106 (H) 70 - 99 mg/dL    Comment: Glucose reference range applies only to samples taken after fasting for at least 8 hours.  Glucose, capillary     Status: Abnormal   Collection Time: 05/27/23 12:15 PM  Result Value Ref Range   Glucose-Capillary 130 (H) 70 - 99 mg/dL    Comment: Glucose reference range applies only to samples taken after fasting for  at least 8 hours.  Glucose, capillary     Status: None   Collection Time: 05/27/23  3:57 PM  Result Value Ref Range   Glucose-Capillary 98 70 - 99 mg/dL    Comment: Glucose reference range applies only to samples taken after fasting for at least 8 hours.  Glucose, capillary     Status: Abnormal   Collection Time: 05/27/23  8:25 PM  Result Value Ref Range   Glucose-Capillary 102 (H) 70 - 99 mg/dL    Comment: Glucose reference range applies only to samples taken after fasting for at least 8 hours.  Glucose, capillary     Status: Abnormal   Collection Time: 05/28/23  1:39 AM  Result Value Ref Range   Glucose-Capillary 119 (H) 70 - 99 mg/dL    Comment: Glucose reference range applies only to samples taken after fasting for at least 8 hours.  Glucose, capillary     Status: Abnormal   Collection Time: 05/28/23  4:01 AM  Result Value Ref Range   Glucose-Capillary 123 (H) 70 - 99 mg/dL    Comment: Glucose reference range applies only to samples taken after fasting for at least 8 hours.  Glucose, capillary     Status: Abnormal   Collection Time: 05/28/23  7:00 AM  Result Value Ref Range   Glucose-Capillary 105 (H) 70 - 99 mg/dL    Comment: Glucose reference range applies only to samples taken after fasting for at least 8 hours.  Comprehensive metabolic panel     Status: Abnormal   Collection Time: 05/28/23 10:36 AM  Result Value Ref Range   Sodium 136 135 - 145 mmol/L   Potassium 3.6 3.5 - 5.1 mmol/L   Chloride 105 98 - 111 mmol/L   CO2 23 22 - 32 mmol/L   Glucose, Bld 125 (H) 70 - 99 mg/dL    Comment: Glucose reference range applies only to samples taken after fasting for at least 8 hours.   BUN 5 (L) 6 - 20 mg/dL   Creatinine, Ser 0.86 0.44 - 1.00 mg/dL   Calcium 8.4 (L) 8.9 - 10.3 mg/dL   Total Protein 7.0 6.5 - 8.1 g/dL   Albumin 3.3 (L) 3.5 - 5.0 g/dL   AST 578 (H) 15 - 41 U/L   ALT 257 (H) 0 - 44 U/L   Alkaline Phosphatase 159 (H) 38 - 126 U/L  Total Bilirubin 4.7 (H) 0.3 -  1.2 mg/dL   GFR, Estimated >16 >10 mL/min    Comment: (NOTE) Calculated using the CKD-EPI Creatinine Equation (2021)    Anion gap 8 5 - 15    Comment: Performed at [facility], [address] [place], [place] [address]  Glucose, capillary     Status: Abnormal   Collection Time: 05/28/23 11:33 AM  Result Value Ref Range   Glucose-Capillary 110 (H) 70 - 99 mg/dL    Comment: Glucose reference range applies only to samples taken after fasting for at least 8 hours.   MR ABDOMEN MRCP W WO CONTAST  Result Date: 05/27/2023 CLINICAL DATA:  Jaundice.  Abdominal pain EXAM: MRI ABDOMEN WITHOUT AND WITH CONTRAST (INCLUDING MRCP) TECHNIQUE: Multiplanar multisequence MR imaging of the abdomen was performed both before and after the administration of intravenous contrast. Heavily T2-weighted images of the biliary and pancreatic ducts were obtained, and three-dimensional MRCP images were rendered by post processing. CONTRAST:  10mL GADAVIST GADOBUTROL 1 MMOL/ML IV SOLN COMPARISON:  Yesterday's ultrasound FINDINGS: Lower chest: Normal heart size without pericardial or pleural effusion. Hepatobiliary: No suspicious liver lesion.  Normal gallbladder. Moderate intrahepatic biliary duct dilatation with the left hepatic duct measuring 7 mm on 15/3. The common duct is also moderately dilated, including at 12 mm on 25/2. Tapers in the region of the pancreatic head, just proximal to the ampulla on 23/2. No underlying stone or mass identified. Pancreas:  Normal, without mass or ductal dilatation. Spleen:  Normal in size, without focal abnormality. Adrenals/Urinary Tract: Normal adrenal glands. Normal kidneys, without hydronephrosis. Stomach/Bowel: Normal stomach and abdominal bowel loops. Vascular/Lymphatic: Normal caliber of the aorta and branch vessels. No retroperitoneal or retrocrural adenopathy. Other:  No ascites. Musculoskeletal: No acute osseous abnormality. IMPRESSION: Moderate intra and extrahepatic biliary  duct dilatation, with tapering of the distal common duct just above the ampulla. No underlying stone or mass identified. Consider further evaluation with ERCP to confirm benign stricture and exclude unlikely occult ampullary or periampullary lesion. Electronically Signed   By: Jeronimo Greaves M.D.   On: 05/27/2023 14:08   MR 3D Recon At Scanner  Result Date: 05/27/2023 CLINICAL DATA:  Jaundice.  Abdominal pain EXAM: MRI ABDOMEN WITHOUT AND WITH CONTRAST (INCLUDING MRCP) TECHNIQUE: Multiplanar multisequence MR imaging of the abdomen was performed both before and after the administration of intravenous contrast. Heavily T2-weighted images of the biliary and pancreatic ducts were obtained, and three-dimensional MRCP images were rendered by post processing. CONTRAST:  10mL GADAVIST GADOBUTROL 1 MMOL/ML IV SOLN COMPARISON:  Yesterday's ultrasound FINDINGS: Lower chest: Normal heart size without pericardial or pleural effusion. Hepatobiliary: No suspicious liver lesion.  Normal gallbladder. Moderate intrahepatic biliary duct dilatation with the left hepatic duct measuring 7 mm on 15/3. The common duct is also moderately dilated, including at 12 mm on 25/2. Tapers in the region of the pancreatic head, just proximal to the ampulla on 23/2. No underlying stone or mass identified. Pancreas:  Normal, without mass or ductal dilatation. Spleen:  Normal in size, without focal abnormality. Adrenals/Urinary Tract: Normal adrenal glands. Normal kidneys, without hydronephrosis. Stomach/Bowel: Normal stomach and abdominal bowel loops. Vascular/Lymphatic: Normal caliber of the aorta and branch vessels. No retroperitoneal or retrocrural adenopathy. Other:  No ascites. Musculoskeletal: No acute osseous abnormality. IMPRESSION: Moderate intra and extrahepatic biliary duct dilatation, with tapering of the distal common duct just above the ampulla. No underlying stone or mass identified. Consider further evaluation with ERCP to confirm  benign stricture  and exclude unlikely occult ampullary or periampullary lesion. Electronically Signed   By: Jeronimo Greaves M.D.   On: 05/27/2023 14:08   US Abdomen Limited RUQ (LIVER/GB)  Result Date: 05/26/2023 CLINICAL DATA:  Right upper quadrant pain and elevated liver function tests. EXAM: ULTRASOUND ABDOMEN LIMITED RIGHT UPPER QUADRANT COMPARISON:  None Available. FINDINGS: Gallbladder: The gallbladder is mildly distended. No gallstones or wall thickening visualized (1.5 mm). A positive sonographic Eulah Pont sign is noted by the sonographer. Common bile duct: Diameter: 13.3 mm Liver: A 0.6 cm x 1.2 cm x 1.2 cm area of hypoechogenicity is seen within the left lobe of the liver. Diffusely increased echogenicity of the liver parenchyma is noted. Mild, central intrahepatic biliary dilatation is seen. Portal vein is patent on color Doppler imaging with normal direction of blood flow towards the liver. Other: None. IMPRESSION: 1. Positive sonographic Murphy sign, in the absence of gallstones, gallbladder wall thickening or pericholecystic fluid. Further evaluation with a nuclear medicine hepatobiliary scan is recommended if acalculous cholecystitis is of clinical concern. 2. Common bile duct dilatation without visualization of an obstructing lesion. Further evaluation with MRCP is recommended. 3. Hepatic steatosis with a small hepatic cyst versus hemangioma suspected within the left lobe of the liver. Correlation with nonemergent hepatic MRI is recommended. Electronically Signed   By: Aram Candela M.D.   On: 05/26/2023 19:05      Assessment/Plan Choledocholithiasis, without evidence of cholecystitis  - afebrile, VSS, WBC 5.8 - s/p ERCP and sphincterotomy 9/20 Dr. Russella Dar - tentative plan for laparoscopic cholecystectomy tomorrow, pending OR availability and evaluation by on-call surgeon Dr. Hillery Hunter  - check AM labs - A1c pending, was 8.1% in April 2024   The operative and non-operative management of  symptomatic cholelithiasis was discussed with the patient. Risks of surgery including bleeding, infection, damage to surrounding structures, conversion to open, drain placement, need for additional procedures, prolonged hospital stay, as well as the risks of general anesthesia were discussed with the patient and she would like to proceed with surgery. Questions were welcomed and answered.   FEN - CLD, NPO after MN VTE - SCD's, lovenox was held by GI ID - received unasyn today prior to endoscopy, Rocephin on call to OR 9/21 Admit - TRH service   Diabetes mellitus, type 2, on insulin  Obestiy, BMI 42   I reviewed nursing notes, Consultant GI notes, hospitalist notes, last 24 h vitals and pain scores, last 48 h intake and output, last 24 h labs and trends, and last 24 h imaging results.  Adam Phenix, [facility] Surgery 05/28/2023, 3:21 PM Please see Amion for pager number during day hours 7:00am-4:30pm or 7:00am -11:30am on weekends

## 2023-05-29 ENCOUNTER — Encounter (HOSPITAL_COMMUNITY): Payer: Self-pay | Admitting: Family Medicine

## 2023-05-29 DIAGNOSIS — K831 Obstruction of bile duct: Secondary | ICD-10-CM | POA: Diagnosis not present

## 2023-05-29 LAB — COMPREHENSIVE METABOLIC PANEL
ALT: 211 U/L — ABNORMAL HIGH (ref 0–44)
AST: 70 U/L — ABNORMAL HIGH (ref 15–41)
Albumin: 3.3 g/dL — ABNORMAL LOW (ref 3.5–5.0)
Alkaline Phosphatase: 144 U/L — ABNORMAL HIGH (ref 38–126)
Anion gap: 7 (ref 5–15)
BUN: 7 mg/dL (ref 6–20)
CO2: 23 mmol/L (ref 22–32)
Calcium: 8.7 mg/dL — ABNORMAL LOW (ref 8.9–10.3)
Chloride: 105 mmol/L (ref 98–111)
Creatinine, Ser: 0.78 mg/dL (ref 0.44–1.00)
GFR, Estimated: >60 mL/min (ref >60)
Glucose, Bld: 119 mg/dL — ABNORMAL HIGH (ref 70–99)
Potassium: 3.8 mmol/L (ref 3.5–5.1)
Sodium: 135 mmol/L (ref 135–145)
Total Bilirubin: 1.8 mg/dL — ABNORMAL HIGH (ref 0.3–1.2)
Total Protein: 6.9 g/dL (ref 6.5–8.1)

## 2023-05-29 LAB — GLUCOSE, CAPILLARY
Glucose-Capillary: 104 mg/dL — ABNORMAL HIGH (ref 70–99)
Glucose-Capillary: 113 mg/dL — ABNORMAL HIGH (ref 70–99)
Glucose-Capillary: 113 mg/dL — ABNORMAL HIGH (ref 70–99)
Glucose-Capillary: 119 mg/dL — ABNORMAL HIGH (ref 70–99)
Glucose-Capillary: 130 mg/dL — ABNORMAL HIGH (ref 70–99)
Glucose-Capillary: 142 mg/dL — ABNORMAL HIGH (ref 70–99)
Glucose-Capillary: 157 mg/dL — ABNORMAL HIGH (ref 70–99)

## 2023-05-29 MED ORDER — INDOCYANINE GREEN 25 MG IV SOLR
2.5000 mg | Freq: Once | INTRAVENOUS | Status: AC
  Administered 2023-05-29: 2.5 mg via INTRAVENOUS

## 2023-05-29 MED ORDER — INSULIN ASPART 100 UNIT/ML IJ SOLN: 0.0000 [IU] | INTRAMUSCULAR | Status: DC | PRN

## 2023-05-29 MED ORDER — LACTATED RINGERS IV SOLN: INTRAVENOUS | Status: DC

## 2023-05-29 MED ORDER — ORAL CARE MOUTH RINSE: 15.0000 mL | Freq: Once | OROMUCOSAL | Status: DC

## 2023-05-29 MED ORDER — CHLORHEXIDINE GLUCONATE 0.12 % MT SOLN: 15.0000 mL | Freq: Once | OROMUCOSAL | Status: AC

## 2023-05-29 MED ORDER — ORAL CARE MOUTH RINSE: 15.0000 mL | Freq: Once | OROMUCOSAL | Status: AC

## 2023-05-29 MED ORDER — CHLORHEXIDINE GLUCONATE 0.12 % MT SOLN: 15.0000 mL | Freq: Once | OROMUCOSAL | Status: DC

## 2023-05-29 MED ORDER — CHLORHEXIDINE GLUCONATE 0.12 % MT SOLN
OROMUCOSAL | Status: AC
  Administered 2023-05-29: 15 mL via OROMUCOSAL
  Filled 2023-05-29: qty 15

## 2023-05-29 NOTE — Plan of Care (Signed)

## 2023-05-29 NOTE — Progress Notes (Signed)
Brandy Garza [birth year]  [id number].    Requesting MD: Renford Dills, MD Chief Complaint/Reason for Consult: choledocholithiasis   Subjective:  Underwent successful ERCP yesterday and denies worsening pain or new complaints today.    ROS: Review of Systems  All other systems reviewed and are negative.   History reviewed. No pertinent family history.  Past Medical History:  Diagnosis Date   Anemia    Bronchitis    Diabetes mellitus without complication (HCC)     Past Surgical History:  Procedure Laterality Date   CESAREAN SECTION     WISDOM TOOTH EXTRACTION Right    bottom    Social History:  reports that she has quit smoking. She has never used smokeless tobacco. She reports current alcohol use. She reports that she does not use drugs.  Allergies:  Allergies  Allergen Reactions   Latex Itching and Other (See Comments)    burn   Flagyl [Metronidazole Hcl] Rash and Other (See Comments)    Bumps all over     Medications Prior to Admission  Medication Sig Dispense Refill   ibuprofen (ADVIL,MOTRIN) 200 MG tablet Take 600 mg by mouth once as needed for headache or moderate pain.     insulin glargine (LANTUS SOLOSTAR) 100 UNIT/ML Solostar Pen Start with 20 U s.q. nightly. 15 mL 11   levonorgestrel (MIRENA) 20 MCG/24HR IUD 1 each by Intrauterine route once. Inserted 2011     metFORMIN (GLUCOPHAGE) 500 MG tablet Take 1 tablet (500 mg total) by mouth 2 (two) times daily. 180 tablet 0     Physical Exam: Blood pressure 115/71, pulse 65, temperature 98.4 F (36.9 C), temperature source Oral, resp. rate 18, height 5' 6.5" (1.689 m), weight 118.4 kg, SpO2 100%. General: Pleasant female laying on hospital bed, appears stated age, NAD. HEENT: head -normocephalic, atraumatic; Eyes: PERRLA, no conjunctival injection; mild upper lip swelling after endoscopy  Neck- Trachea is midline CV- RRR, normal S1/S2, no M/R/G, no lower extremity edema  Pulm- breathing is non-labored  ORA Abd- soft, NT/ND, no palpable masses or hernias GU- deferred  MSK- UE/LE symmetrical, no cyanosis, clubbing, or edema. Neuro- CN II-XII grossly in tact, no paresthesias. Psych- Alert and Oriented x3 with appropriate affect Skin: warm and dry, no rashes or lesions   Results for orders placed or performed during the hospital encounter of 05/26/23 (from the past 48 hour(s))  Protime-INR     Status: None   Collection Time: 05/27/23 11:06 AM  Result Value Ref Range   Prothrombin Time 13.5 11.4 - 15.2 seconds   INR 1.0 0.8 - 1.2    Comment: (NOTE) INR goal varies based on device and disease states. Performed at [facility], [address] [place], [place] [address]   Glucose, capillary     Status: Abnormal   Collection Time: 05/27/23 11:29 AM  Result Value Ref Range   Glucose-Capillary 106 (H) 70 - 99 mg/dL    Comment: Glucose reference range applies only to samples taken after fasting for at least 8 hours.  Glucose, capillary     Status: Abnormal   Collection Time: 05/27/23 12:15 PM  Result Value Ref Range   Glucose-Capillary 130 (H) 70 - 99 mg/dL    Comment: Glucose reference range applies only to samples taken after fasting for at least 8 hours.  Glucose, capillary     Status: None   Collection Time: 05/27/23  3:57 PM  Result Value Ref Range   Glucose-Capillary 98 70 - 99  mg/dL    Comment: Glucose reference range applies only to samples taken after fasting for at least 8 hours.  Glucose, capillary     Status: Abnormal   Collection Time: 05/27/23  8:25 PM  Result Value Ref Range   Glucose-Capillary 102 (H) 70 - 99 mg/dL    Comment: Glucose reference range applies only to samples taken after fasting for at least 8 hours.  Glucose, capillary     Status: Abnormal   Collection Time: 05/28/23  1:39 AM  Result Value Ref Range   Glucose-Capillary 119 (H) 70 - 99 mg/dL    Comment: Glucose reference range applies only to samples taken after fasting for at least 8 hours.   Glucose, capillary     Status: Abnormal   Collection Time: 05/28/23  4:01 AM  Result Value Ref Range   Glucose-Capillary 123 (H) 70 - 99 mg/dL    Comment: Glucose reference range applies only to samples taken after fasting for at least 8 hours.  Glucose, capillary     Status: Abnormal   Collection Time: 05/28/23  7:00 AM  Result Value Ref Range   Glucose-Capillary 105 (H) 70 - 99 mg/dL    Comment: Glucose reference range applies only to samples taken after fasting for at least 8 hours.  Comprehensive metabolic panel     Status: Abnormal   Collection Time: 05/28/23 10:36 AM  Result Value Ref Range   Sodium 136 135 - 145 mmol/L   Potassium 3.6 3.5 - 5.1 mmol/L   Chloride 105 98 - 111 mmol/L   CO2 23 22 - 32 mmol/L   Glucose, Bld 125 (H) 70 - 99 mg/dL    Comment: Glucose reference range applies only to samples taken after fasting for at least 8 hours.   BUN 5 (L) 6 - 20 mg/dL   Creatinine, Ser 0.16 0.44 - 1.00 mg/dL   Calcium 8.4 (L) 8.9 - 10.3 mg/dL   Total Protein 7.0 6.5 - 8.1 g/dL   Albumin 3.3 (L) 3.5 - 5.0 g/dL   AST 010 (H) 15 - 41 U/L   ALT 257 (H) 0 - 44 U/L   Alkaline Phosphatase 159 (H) 38 - 126 U/L   Total Bilirubin 4.7 (H) 0.3 - 1.2 mg/dL   GFR, Estimated >93 >23 mL/min    Comment: (NOTE) Calculated using the CKD-EPI Creatinine Equation (2021)    Anion gap 8 5 - 15    Comment: Performed at [facility], [address] [place], [place] [address]  Glucose, capillary     Status: Abnormal   Collection Time: 05/28/23 11:33 AM  Result Value Ref Range   Glucose-Capillary 110 (H) 70 - 99 mg/dL    Comment: Glucose reference range applies only to samples taken after fasting for at least 8 hours.  Glucose, capillary     Status: Abnormal   Collection Time: 05/28/23  3:32 PM  Result Value Ref Range   Glucose-Capillary 121 (H) 70 - 99 mg/dL    Comment: Glucose reference range applies only to samples taken after fasting for at least 8 hours.  Hemoglobin A1c     Status:  Abnormal   Collection Time: 05/28/23  3:42 PM  Result Value Ref Range   Hgb A1c MFr Bld 7.9 (H) 4.8 - 5.6 %    Comment: (NOTE) Pre diabetes:          5.7%-6.4%  Diabetes:              >6.4%  Glycemic control for   <7.0% adults with diabetes    Mean Plasma Glucose 180.03 mg/dL    Comment: Performed at [facility], [address] [place], [place] [address]  Glucose, capillary     Status: Abnormal   Collection Time: 05/28/23  9:29 PM  Result Value Ref Range   Glucose-Capillary 162 (H) 70 - 99 mg/dL    Comment: Glucose reference range applies only to samples taken after fasting for at least 8 hours.  Glucose, capillary     Status: Abnormal   Collection Time: 05/29/23 12:13 AM  Result Value Ref Range   Glucose-Capillary 119 (H) 70 - 99 mg/dL    Comment: Glucose reference range applies only to samples taken after fasting for at least 8 hours.  Comprehensive metabolic panel     Status: Abnormal   Collection Time: 05/29/23  5:01 AM  Result Value Ref Range   Sodium 135 135 - 145 mmol/L   Potassium 3.8 3.5 - 5.1 mmol/L   Chloride 105 98 - 111 mmol/L   CO2 23 22 - 32 mmol/L   Glucose, Bld 119 (H) 70 - 99 mg/dL    Comment: Glucose reference range applies only to samples taken after fasting for at least 8 hours.   BUN 7 6 - 20 mg/dL   Creatinine, Ser 6.44 0.44 - 1.00 mg/dL   Calcium 8.7 (L) 8.9 - 10.3 mg/dL   Total Protein 6.9 6.5 - 8.1 g/dL   Albumin 3.3 (L) 3.5 - 5.0 g/dL   AST 70 (H) 15 - 41 U/L   ALT 211 (H) 0 - 44 U/L   Alkaline Phosphatase 144 (H) 38 - 126 U/L   Total Bilirubin 1.8 (H) 0.3 - 1.2 mg/dL   GFR, Estimated >03 >47 mL/min    Comment: (NOTE) Calculated using the CKD-EPI Creatinine Equation (2021)    Anion gap 7 5 - 15    Comment: Performed at [facility], [address], [place], [place] [address]  Glucose, capillary     Status: Abnormal   Collection Time: 05/29/23  5:21 AM  Result Value Ref Range   Glucose-Capillary 104 (H) 70 - 99 mg/dL     Comment: Glucose reference range applies only to samples taken after fasting for at least 8 hours.  Glucose, capillary     Status: Abnormal   Collection Time: 05/29/23  7:29 AM  Result Value Ref Range   Glucose-Capillary 113 (H) 70 - 99 mg/dL    Comment: Glucose reference range applies only to samples taken after fasting for at least 8 hours.   DG ERCP  Result Date: 05/29/2023 CLINICAL DATA:  Choledocholithiasis EXAM: ERCP TECHNIQUE: Multiple spot images obtained with the fluoroscopic device and submitted for interpretation post-procedure. FLUOROSCOPY: Radiation Exposure Index (as provided by the fluoroscopic device): 93.2 mGy Kerma COMPARISON:  MRCP 05/27/2023 FINDINGS: Total of 10 intraoperative saved images are submitted for review. The images demonstrate a flexible duodenal scope in the descending duodenum followed by wire cannulation of the common hepatic duct and cholangiography. Cholangiography demonstrates ductal dilation with a distal filling defect consistent with choledocholithiasis. Subsequent images document sphincterotomy and balloon sweeping of the common duct. IMPRESSION: 1. Choledocholithiasis. 2. ERCP with sphincterotomy and balloon sweeping of the common duct. These images were submitted for radiologic interpretation only. Please see the procedural report for the amount of contrast and the fluoroscopy time utilized. Electronically Signed   By: Malachy Moan M.D.   On: 05/29/2023 07:21   MR  ABDOMEN MRCP W WO CONTAST  Result Date: 05/27/2023 CLINICAL DATA:  Jaundice.  Abdominal pain EXAM: MRI ABDOMEN WITHOUT AND WITH CONTRAST (INCLUDING MRCP) TECHNIQUE: Multiplanar multisequence MR imaging of the abdomen was performed both before and after the administration of intravenous contrast. Heavily T2-weighted images of the biliary and pancreatic ducts were obtained, and three-dimensional MRCP images were rendered by post processing. CONTRAST:  10mL GADAVIST GADOBUTROL 1 MMOL/ML IV SOLN  COMPARISON:  Yesterday's ultrasound FINDINGS: Lower chest: Normal heart size without pericardial or pleural effusion. Hepatobiliary: No suspicious liver lesion.  Normal gallbladder. Moderate intrahepatic biliary duct dilatation with the left hepatic duct measuring 7 mm on 15/3. The common duct is also moderately dilated, including at 12 mm on 25/2. Tapers in the region of the pancreatic head, just proximal to the ampulla on 23/2. No underlying stone or mass identified. Pancreas:  Normal, without mass or ductal dilatation. Spleen:  Normal in size, without focal abnormality. Adrenals/Urinary Tract: Normal adrenal glands. Normal kidneys, without hydronephrosis. Stomach/Bowel: Normal stomach and abdominal bowel loops. Vascular/Lymphatic: Normal caliber of the aorta and branch vessels. No retroperitoneal or retrocrural adenopathy. Other:  No ascites. Musculoskeletal: No acute osseous abnormality. IMPRESSION: Moderate intra and extrahepatic biliary duct dilatation, with tapering of the distal common duct just above the ampulla. No underlying stone or mass identified. Consider further evaluation with ERCP to confirm benign stricture and exclude unlikely occult ampullary or periampullary lesion. Electronically Signed   By: Jeronimo Greaves M.D.   On: 05/27/2023 14:08   MR 3D Recon At Scanner  Result Date: 05/27/2023 CLINICAL DATA:  Jaundice.  Abdominal pain EXAM: MRI ABDOMEN WITHOUT AND WITH CONTRAST (INCLUDING MRCP) TECHNIQUE: Multiplanar multisequence MR imaging of the abdomen was performed both before and after the administration of intravenous contrast. Heavily T2-weighted images of the biliary and pancreatic ducts were obtained, and three-dimensional MRCP images were rendered by post processing. CONTRAST:  10mL GADAVIST GADOBUTROL 1 MMOL/ML IV SOLN COMPARISON:  Yesterday's ultrasound FINDINGS: Lower chest: Normal heart size without pericardial or pleural effusion. Hepatobiliary: No suspicious liver lesion.  Normal  gallbladder. Moderate intrahepatic biliary duct dilatation with the left hepatic duct measuring 7 mm on 15/3. The common duct is also moderately dilated, including at 12 mm on 25/2. Tapers in the region of the pancreatic head, just proximal to the ampulla on 23/2. No underlying stone or mass identified. Pancreas:  Normal, without mass or ductal dilatation. Spleen:  Normal in size, without focal abnormality. Adrenals/Urinary Tract: Normal adrenal glands. Normal kidneys, without hydronephrosis. Stomach/Bowel: Normal stomach and abdominal bowel loops. Vascular/Lymphatic: Normal caliber of the aorta and branch vessels. No retroperitoneal or retrocrural adenopathy. Other:  No ascites. Musculoskeletal: No acute osseous abnormality. IMPRESSION: Moderate intra and extrahepatic biliary duct dilatation, with tapering of the distal common duct just above the ampulla. No underlying stone or mass identified. Consider further evaluation with ERCP to confirm benign stricture and exclude unlikely occult ampullary or periampullary lesion. Electronically Signed   By: Jeronimo Greaves M.D.   On: 05/27/2023 14:08      Assessment/Plan Choledocholithiasis, without evidence of cholecystitis  - s/p ERCP - Will plan for lap chole today.  All questions addressed and consent obtained.   The operative and non-operative management of symptomatic cholelithiasis was discussed with the patient. Risks of surgery including bleeding, infection, damage to surrounding structures, conversion to open, drain placement, need for additional procedures, prolonged hospital stay, as well as the risks of general anesthesia were discussed with the patient and she would like to  proceed with surgery. Questions were welcomed and answered.   FEN - NPO VTE - SCD's, lovenox was held by GI ID - received unasyn today prior to endoscopy, Rocephin on call to OR 9/21 Admit - TRH service   Diabetes mellitus, type 2, on insulin  Obestiy, BMI 42   I reviewed  nursing notes, Consultant GI notes, hospitalist notes, last 24 h vitals and pain scores, last 48 h intake and output, last 24 h labs and trends, and last 24 h imaging results.  Moise Boring, [facility] Surgery 05/29/2023, 7:38 AM Please see Amion for pager number during day hours 7:00am-4:30pm or 7:00am -11:30am on weekends

## 2023-05-29 NOTE — Progress Notes (Signed)
PROGRESS NOTE  Brandy Garza  HCW:[id number] DOB: 11-18-1990 DOA: 05/26/2023 PCP: Mliss Sax, MD   Brief Narrative: Patient is a 32 year old female with history of type 2 diabetes, morbid obesity who presented with right upper quadrant pain, nausea, vomiting, diarrhea.  On presentation she was hemodynamically stable.  Lab work showed elevated liver enzymes, elevated bilirubin.  No leukocytosis or elevated lipase.  Right upper quadrant ultrasound showed dilated CBD of 13 mm , positive sonographic Murphy sign without gallstones or gallbladder wall thickening or pericholecystic fluid.  GI consulted.  MRCP showed intrahepatic, extrahepatic biliary duct dilation with tapering at distal CBD.  Patient underwent ERCP , found to have a stone and sludge causing obstruction, moderate dilation of CBD s/p biliary sphincterotomy and balloon extraction.  General surgery being consulted for consideration of cholecystectomy.  Plan for cholecystectomy tomorrow  Assessment & Plan:  Principal Problem:   Biliary obstruction Active Problems:   Type 2 diabetes mellitus with hyperglycemia, without long-term current use of insulin (HCC)   Abnormal magnetic resonance cholangiopancreatography (MRCP)   Elevated LFTs   RUQ abdominal pain   Abdominal pain/biliary obstruction: Presented with right upper quadrant pain, nausea, vomiting.  Lab work showed elevated liver enzymes, elevated bilirubin.  No leukocytosis or elevated lipase.  Right upper quadrant ultrasound showed 13 mm CBD, positive sonographic Murphy sign without gallstones or gallbladder wall thickening or pericholecystic fluid.  GI consulted.  Patient underwent ERCP , found to have a stone and sludge causing obstruction, moderate dilation of CBD s/p biliary sphincterotomy and balloon extraction.  General surgery  consulted for consideration of cholecystectomy.  Plan for cholecystectomy tomorrow. Liver enzymes improving.  Type 2 diabetes: Recent A1c of  8.1.  Currently on sliding scale  insulin.  Morbid obesity: BMI 41.5        DVT prophylaxis:SCDs Start: 05/27/23 0441     Code Status: Full Code  Family Communication: None at bedside  Patient status:Inpatient  Patient is from :home  Anticipated discharge VV:OHYW  Estimated DC date:after cholecystectomy  Consultants: GI, general surgery  Procedures: ERCP  Antimicrobials:  Anti-infectives (From admission, onward)    Start     Dose/Rate Route Frequency Ordered Stop   05/29/23 0600  cefTRIAXone (ROCEPHIN) 2 g in sodium chloride 0.9 % 100 mL IVPB       Note to Pharmacy: Pharmacy may adjust dosing strength / duration / interval for maximal efficacy   2 g 200 mL/hr over 30 Minutes Intravenous On call to O.R. 05/28/23 1542 05/30/23 0559   05/28/23 1015  Ampicillin-Sulbactam (UNASYN) 3 g in sodium chloride 0.9 % 100 mL IVPB        3 g 200 mL/hr over 30 Minutes Intravenous  Once 05/28/23 0920 05/28/23 1256       Subjective:  Patient seen and examined at bedside today.  Hemodynamically stable.  She was returned from the OR because the surgery was canceled.  Any abdomen pain, nausea or vomiting  Objective: Vitals:   05/28/23 1403 05/28/23 2055 05/29/23 0520 05/29/23 0940  BP: 118/77 122/79 115/71 115/70  Pulse: 69 69 65 80  Resp: 16 18 18 18   Temp: 98.5 F (36.9 C) 98.4 F (36.9 C) 98.4 F (36.9 C) 98.2 F (36.8 C)  TempSrc: Oral Oral Oral Oral  SpO2: 99% 100% 100% 98%  Weight:      Height:        Intake/Output Summary (Last 24 hours) at 05/29/2023 1143 Last data filed at 05/28/2023 1306 Gross per 24 hour  Intake 600 ml  Output 0 ml  Net 600 ml   Filed Weights   05/26/23 1752 05/27/23 0429  Weight: 109.8 kg 118.4 kg    Examination:  General exam: Overall comfortable, not in distress,obese HEENT: PERRL Respiratory system:  no wheezes or crackles  Cardiovascular system: S1 & S2 heard, RRR.  Gastrointestinal system: Abdomen is nondistended, soft and  nontender. Central nervous system: Alert and oriented Extremities: No edema, no clubbing ,no cyanosis Skin: No rashes, no ulcers,no icterus    Data Reviewed: I have personally reviewed following labs and imaging studies  CBC: Recent Labs  Lab 05/26/23 1447 05/26/23 1758 05/27/23 0449  WBC 5.6 5.9 5.8  NEUTROABS 3.9 4.3  --   HGB 13.4 13.6 12.3  HCT 41.7 41.7 38.9  MCV 79.9 79.9* 80.2  PLT 317.0 325 304   Basic Metabolic Panel: Recent Labs  Lab 05/26/23 1447 05/26/23 1758 05/27/23 0449 05/28/23 1036 05/29/23 0501  NA 138 136 136 136 135  K 3.6 3.3* 3.7 3.6 3.8  CL 100 99 101 105 105  CO2 30 25 25 23 23   GLUCOSE 116* 131* 123* 125* 119*  BUN 10 12 8  5* 7  CREATININE 0.77 0.81 0.76 0.73 0.78  CALCIUM 9.2 8.9 8.9 8.4* 8.7*  MG  --   --  2.3  --   --      No results found for this or any previous visit (from the past 240 hour(s)).   Radiology Studies: DG ERCP  Result Date: 05/29/2023 CLINICAL DATA:  Choledocholithiasis EXAM: ERCP TECHNIQUE: Multiple spot images obtained with the fluoroscopic device and submitted for interpretation post-procedure. FLUOROSCOPY: Radiation Exposure Index (as provided by the fluoroscopic device): 93.2 mGy Kerma COMPARISON:  MRCP 05/27/2023 FINDINGS: Total of 10 intraoperative saved images are submitted for review. The images demonstrate a flexible duodenal scope in the descending duodenum followed by wire cannulation of the common hepatic duct and cholangiography. Cholangiography demonstrates ductal dilation with a distal filling defect consistent with choledocholithiasis. Subsequent images document sphincterotomy and balloon sweeping of the common duct. IMPRESSION: 1. Choledocholithiasis. 2. ERCP with sphincterotomy and balloon sweeping of the common duct. These images were submitted for radiologic interpretation only. Please see the procedural report for the amount of contrast and the fluoroscopy time utilized. Electronically Signed   By: Malachy Moan M.D.   On: 05/29/2023 07:21    Scheduled Meds:  insulin aspart  0-6 Units Subcutaneous Q4H   Continuous Infusions:  cefTRIAXone (ROCEPHIN)  IV     lactated ringers 10 mL/hr at 05/29/23 0756     LOS: 2 days   Burnadette Pop, MD [facility] P9/21/2024, 11:43 AM

## 2023-05-29 NOTE — Progress Notes (Signed)
Surgery cancelled today. Report called to [facility] nurse. Pt. Transported back to unit via bed.

## 2023-05-30 ENCOUNTER — Inpatient Hospital Stay (HOSPITAL_COMMUNITY): Payer: Medicaid Other | Admitting: Certified Registered Nurse Anesthetist

## 2023-05-30 ENCOUNTER — Encounter (HOSPITAL_COMMUNITY): Payer: Self-pay | Admitting: Family Medicine

## 2023-05-30 ENCOUNTER — Encounter (HOSPITAL_COMMUNITY)
Admission: EM | Disposition: A | Discharge status: Home / Self Care | Payer: Self-pay | Source: Home / Self Care | Attending: Internal Medicine

## 2023-05-30 ENCOUNTER — Other Ambulatory Visit: Payer: Self-pay

## 2023-05-30 DIAGNOSIS — K811 Chronic cholecystitis: Secondary | ICD-10-CM

## 2023-05-30 DIAGNOSIS — K831 Obstruction of bile duct: Secondary | ICD-10-CM | POA: Diagnosis not present

## 2023-05-30 HISTORY — PX: CHOLECYSTECTOMY: SHX55

## 2023-05-30 LAB — GLUCOSE, CAPILLARY
Glucose-Capillary: 134 mg/dL — ABNORMAL HIGH (ref 70–99)
Glucose-Capillary: 140 mg/dL — ABNORMAL HIGH (ref 70–99)
Glucose-Capillary: 152 mg/dL — ABNORMAL HIGH (ref 70–99)
Glucose-Capillary: 154 mg/dL — ABNORMAL HIGH (ref 70–99)
Glucose-Capillary: 167 mg/dL — ABNORMAL HIGH (ref 70–99)
Glucose-Capillary: 207 mg/dL — ABNORMAL HIGH (ref 70–99)

## 2023-05-30 SURGERY — LAPAROSCOPIC CHOLECYSTECTOMY
Anesthesia: General | Site: Abdomen

## 2023-05-30 MED ORDER — PHENYLEPHRINE 80 MCG/ML (10ML) SYRINGE FOR IV PUSH (FOR BLOOD PRESSURE SUPPORT)
PREFILLED_SYRINGE | INTRAVENOUS | Status: DC | PRN
Start: 2023-05-30 — End: 2023-05-30
  Administered 2023-05-30: 80 ug via INTRAVENOUS
  Administered 2023-05-30 (×2): 40 ug via INTRAVENOUS

## 2023-05-30 MED ORDER — OXYCODONE HCL 5 MG PO TABS: 5.0000 mg | ORAL_TABLET | ORAL | 0 refills | Status: DC | PRN

## 2023-05-30 MED ORDER — ORAL CARE MOUTH RINSE: 15.0000 mL | Freq: Once | OROMUCOSAL | Status: AC

## 2023-05-30 MED ORDER — INSULIN ASPART 100 UNIT/ML IJ SOLN
0.0000 [IU] | INTRAMUSCULAR | Status: DC | PRN
  Administered 2023-05-30: 2 [IU] via SUBCUTANEOUS

## 2023-05-30 MED ORDER — DEXAMETHASONE SODIUM PHOSPHATE 10 MG/ML IJ SOLN
INTRAMUSCULAR | Status: DC | PRN
  Administered 2023-05-30: 4 mg via INTRAVENOUS

## 2023-05-30 MED ORDER — LACTATED RINGERS IV SOLN: INTRAVENOUS | Status: DC

## 2023-05-30 MED ORDER — 0.9 % SODIUM CHLORIDE (POUR BTL) OPTIME
TOPICAL | Status: DC | PRN
  Administered 2023-05-30: 1000 mL

## 2023-05-30 MED ORDER — ONDANSETRON HCL 4 MG/2ML IJ SOLN
INTRAMUSCULAR | Status: DC | PRN
  Administered 2023-05-30: 4 mg via INTRAVENOUS

## 2023-05-30 MED ORDER — PROPOFOL 10 MG/ML IV BOLUS
INTRAVENOUS | Status: DC | PRN
  Administered 2023-05-30: 200 mg via INTRAVENOUS

## 2023-05-30 MED ORDER — MIDAZOLAM HCL 2 MG/2ML IJ SOLN
INTRAMUSCULAR | Status: AC
  Filled 2023-05-30: qty 2

## 2023-05-30 MED ORDER — ONDANSETRON HCL 4 MG/2ML IJ SOLN: 4.0000 mg | Freq: Four times a day (QID) | INTRAMUSCULAR | Status: DC | PRN

## 2023-05-30 MED ORDER — MIDAZOLAM HCL 5 MG/5ML IJ SOLN
INTRAMUSCULAR | Status: DC | PRN
  Administered 2023-05-30: 2 mg via INTRAVENOUS

## 2023-05-30 MED ORDER — FENTANYL CITRATE (PF) 100 MCG/2ML IJ SOLN
25.0000 ug | INTRAMUSCULAR | Status: DC | PRN
  Administered 2023-05-30 (×2): 50 ug via INTRAVENOUS

## 2023-05-30 MED ORDER — OXYCODONE HCL 5 MG PO TABS
5.0000 mg | ORAL_TABLET | ORAL | Status: DC | PRN
  Administered 2023-05-30: 5 mg via ORAL
  Filled 2023-05-30: qty 1

## 2023-05-30 MED ORDER — FENTANYL CITRATE (PF) 250 MCG/5ML IJ SOLN
INTRAMUSCULAR | Status: DC | PRN
  Administered 2023-05-30: 100 ug via INTRAVENOUS
  Administered 2023-05-30 (×3): 50 ug via INTRAVENOUS

## 2023-05-30 MED ORDER — BUPIVACAINE-EPINEPHRINE (PF) 0.25% -1:200000 IJ SOLN
INTRAMUSCULAR | Status: AC
  Filled 2023-05-30: qty 30

## 2023-05-30 MED ORDER — OXYCODONE HCL 5 MG PO TABS: 5.0000 mg | ORAL_TABLET | Freq: Once | ORAL | Status: DC | PRN

## 2023-05-30 MED ORDER — OXYCODONE HCL 5 MG/5ML PO SOLN: 5.0000 mg | Freq: Once | ORAL | Status: DC | PRN

## 2023-05-30 MED ORDER — LIDOCAINE 2% (20 MG/ML) 5 ML SYRINGE
INTRAMUSCULAR | Status: DC | PRN
  Administered 2023-05-30: 60 mg via INTRAVENOUS

## 2023-05-30 MED ORDER — FENTANYL CITRATE (PF) 100 MCG/2ML IJ SOLN
INTRAMUSCULAR | Status: AC
  Filled 2023-05-30: qty 2

## 2023-05-30 MED ORDER — SODIUM CHLORIDE 0.9 % IR SOLN
Status: DC | PRN
  Administered 2023-05-30: 400 mL

## 2023-05-30 MED ORDER — ONDANSETRON HCL 4 MG/2ML IJ SOLN: 4.0000 mg | Freq: Once | INTRAMUSCULAR | Status: DC | PRN

## 2023-05-30 MED ORDER — SUGAMMADEX SODIUM 200 MG/2ML IV SOLN
INTRAVENOUS | Status: DC | PRN
  Administered 2023-05-30: 200 mg via INTRAVENOUS

## 2023-05-30 MED ORDER — ACETAMINOPHEN 500 MG PO TABS
1000.0000 mg | ORAL_TABLET | Freq: Three times a day (TID) | ORAL | Status: DC
  Administered 2023-05-30: 1000 mg via ORAL
  Filled 2023-05-30: qty 2

## 2023-05-30 MED ORDER — BUPIVACAINE-EPINEPHRINE 0.25% -1:200000 IJ SOLN
INTRAMUSCULAR | Status: DC | PRN
  Administered 2023-05-30: 30 mL

## 2023-05-30 MED ORDER — FENTANYL CITRATE (PF) 100 MCG/2ML IJ SOLN: 25.0000 ug | INTRAMUSCULAR | Status: DC | PRN

## 2023-05-30 MED ORDER — CEFAZOLIN SODIUM-DEXTROSE 2-3 GM-%(50ML) IV SOLR
INTRAVENOUS | Status: DC | PRN
Start: 2023-05-30 — End: 2023-05-30
  Administered 2023-05-30: 2 g via INTRAVENOUS

## 2023-05-30 MED ORDER — CHLORHEXIDINE GLUCONATE 0.12 % MT SOLN
15.0000 mL | Freq: Once | OROMUCOSAL | Status: AC
  Administered 2023-05-30: 15 mL via OROMUCOSAL

## 2023-05-30 MED ORDER — PHENYLEPHRINE HCL-NACL 20-0.9 MG/250ML-% IV SOLN
INTRAVENOUS | Status: DC | PRN
Start: 2023-05-30 — End: 2023-05-30
  Administered 2023-05-30: 50 ug/min via INTRAVENOUS

## 2023-05-30 MED ORDER — FENTANYL CITRATE (PF) 250 MCG/5ML IJ SOLN
INTRAMUSCULAR | Status: AC
  Filled 2023-05-30: qty 5

## 2023-05-30 MED ORDER — PROPOFOL 10 MG/ML IV BOLUS
INTRAVENOUS | Status: AC
  Filled 2023-05-30: qty 20

## 2023-05-30 MED ORDER — ROCURONIUM BROMIDE 10 MG/ML (PF) SYRINGE
PREFILLED_SYRINGE | INTRAVENOUS | Status: DC | PRN
  Administered 2023-05-30: 50 mg via INTRAVENOUS

## 2023-05-30 SURGICAL SUPPLY — 38 items
ADH SKN CLS APL DERMABOND .7 (GAUZE/BANDAGES/DRESSINGS) ×2
APL PRP STRL LF DISP 70% ISPRP (MISCELLANEOUS) ×2
APPLIER CLIP 5 13 M/L LIGAMAX5 (MISCELLANEOUS) ×2 IMPLANT
APR CLP MED LRG 5 ANG JAW (MISCELLANEOUS) ×2
BAG COUNTER SPONGE SURGICOUNT (BAG) ×2 IMPLANT
BAG SPNG CNTER NS LX DISP (BAG) ×2
CANISTER SUCT 3000ML PPV (MISCELLANEOUS) ×2 IMPLANT
CHLORAPREP W/TINT 26 (MISCELLANEOUS) ×2 IMPLANT
CLIP APPLIE 5 13 M/L LIGAMAX5 (MISCELLANEOUS) ×2 IMPLANT
COVER SURGICAL LIGHT HANDLE (MISCELLANEOUS) ×2 IMPLANT
DERMABOND ADVANCED .7 DNX12 (GAUZE/BANDAGES/DRESSINGS) ×2 IMPLANT
ELECT REM PT RETURN 9FT ADLT (ELECTROSURGICAL) ×2 IMPLANT
ELECTRODE REM PT RTRN 9FT ADLT (ELECTROSURGICAL) ×2 IMPLANT
GOWN STRL REUS W/ TWL LRG LVL3 (GOWN DISPOSABLE) ×6 IMPLANT
GOWN STRL REUS W/TWL LRG LVL3 (GOWN DISPOSABLE) ×6
IRRIG SUCT STRYKERFLOW 2 WTIP (MISCELLANEOUS) ×2 IMPLANT
IRRIGATION SUCT STRKRFLW 2 WTP (MISCELLANEOUS) ×2 IMPLANT
KIT BASIN OR (CUSTOM PROCEDURE TRAY) ×2 IMPLANT
KIT TURNOVER KIT B (KITS) ×2 IMPLANT
L-HOOK LAP DISP 36CM (ELECTROSURGICAL) ×2 IMPLANT
LHOOK LAP DISP 36CM (ELECTROSURGICAL) ×2 IMPLANT
NDL INSUFFLATION 14GA 120MM (NEEDLE) IMPLANT
NEEDLE INSUFFLATION 14GA 120MM (NEEDLE) ×2 IMPLANT
NS IRRIG 1000ML POUR BTL (IV SOLUTION) ×2 IMPLANT
PAD ARMBOARD 7.5X6 YLW CONV (MISCELLANEOUS) ×2 IMPLANT
PENCIL BUTTON HOLSTER BLD 10FT (ELECTRODE) ×2 IMPLANT
SCISSORS LAP 5X35 DISP (ENDOMECHANICALS) ×2 IMPLANT
SET TUBE SMOKE EVAC HIGH FLOW (TUBING) ×2 IMPLANT
SLEEVE Z-THREAD 5X100MM (TROCAR) ×4 IMPLANT
SUT MNCRL AB 4-0 PS2 18 (SUTURE) ×2 IMPLANT
SYS BAG RETRIEVAL 10MM (BASKET) ×2 IMPLANT
SYSTEM BAG RETRIEVAL 10MM (BASKET) IMPLANT
TOWEL GREEN STERILE (TOWEL DISPOSABLE) ×2 IMPLANT
TRAY LAPAROSCOPIC MC (CUSTOM PROCEDURE TRAY) ×2 IMPLANT
TROCAR 11X100 Z THREAD (TROCAR) IMPLANT
TROCAR Z-THREAD OPTICAL 5X100M (TROCAR) ×2 IMPLANT
WARMER LAPAROSCOPE (MISCELLANEOUS) ×2 IMPLANT
WATER STERILE IRR 1000ML POUR (IV SOLUTION) ×2 IMPLANT

## 2023-05-30 NOTE — Transfer of Care (Signed)
Immediate Anesthesia Transfer of Care Note  Patient: Brandy Garza  Procedure(s) Performed: LAPAROSCOPIC CHOLECYSTECTOMY INDOCYANINE GREEN FLUORESCENCE IMAGING (ICG) (Abdomen)  Patient Location: PACU  Anesthesia Type:General  Level of Consciousness: awake  Airway & Oxygen Therapy: Patient Spontanous Breathing and Patient connected to nasal cannula oxygen  Post-op Assessment: Report given to RN and Post -op Vital signs reviewed and stable  Post vital signs: Reviewed and stable  Last Vitals:  Vitals Value Taken Time  BP 121/84 05/30/23 0904  Temp    Pulse 70 05/30/23 0909  Resp 15 05/30/23 0909  SpO2 100 % 05/30/23 0909  Vitals shown include unfiled device data.  Last Pain:  Vitals:   05/30/23 0727  TempSrc: Oral  PainSc: 0-No pain      Patients Stated Pain Goal: 0 (05/28/23 0927)  Complications: No notable events documented.

## 2023-05-30 NOTE — Discharge Summary (Signed)
Physician Discharge Summary  Brandy Garza ZOX:[id number] DOB: 08/30/1991 DOA: 05/26/2023  PCP: Mliss Sax, MD  Admit date: 05/26/2023 Discharge date: 05/30/2023  Admitted From: Home Disposition:  Home  Discharge Condition:Stable CODE STATUS:FULL, DNR, Comfort Care Diet recommendation: Heart Healthy / Carb Modified / Regular / Dysphagia   Brief/Interim Summary:   Following problems were addressed during the hospitalization:   Discharge Diagnoses:  Principal Problem:   Biliary obstruction Active Problems:   Type 2 diabetes mellitus with hyperglycemia, without long-term current use of insulin (HCC)   Abnormal magnetic resonance cholangiopancreatography (MRCP)   Elevated LFTs   RUQ abdominal pain    Discharge Instructions  Discharge Instructions     Diet Carb Modified   Complete by: As directed    Discharge instructions   Complete by: As directed    1)Please follow up with general surgery as an outpatient.   Increase activity slowly   Complete by: As directed       Allergies as of 05/30/2023       Reactions   Latex Itching, Other (See Comments)   burn   Flagyl [metronidazole Hcl] Rash, Other (See Comments)   Bumps all over         Medication List     TAKE these medications    ibuprofen 200 MG tablet Commonly known as: ADVIL Take 600 mg by mouth once as needed for headache or moderate pain.   Lantus SoloStar 100 UNIT/ML Solostar Pen Generic drug: insulin glargine Start with 20 U s.q. nightly.   levonorgestrel 20 MCG/24HR IUD Commonly known as: MIRENA 1 each by Intrauterine route once. Inserted 2011   metFORMIN 500 MG tablet Commonly known as: GLUCOPHAGE Take 1 tablet (500 mg total) by mouth 2 (two) times daily.   oxyCODONE 5 MG immediate release tablet Commonly known as: Oxy IR/ROXICODONE Take 1 tablet (5 mg total) by mouth every 4 (four) hours as needed for severe pain.        Follow-up Information     Fritzi Mandes, MD.  Schedule an appointment as soon as possible for a visit in 2 week(s).   Specialty: General Surgery Contact information: [address] [place] [address] [phone]                Allergies  Allergen Reactions   Latex Itching and Other (See Comments)    burn   Flagyl [Metronidazole Hcl] Rash and Other (See Comments)    Bumps all over     Consultations:    Procedures/Studies: DG ERCP  Result Date: 05/29/2023 CLINICAL DATA:  Choledocholithiasis EXAM: ERCP TECHNIQUE: Multiple spot images obtained with the fluoroscopic device and submitted for interpretation post-procedure. FLUOROSCOPY: Radiation Exposure Index (as provided by the fluoroscopic device): 93.2 mGy Kerma COMPARISON:  MRCP 05/27/2023 FINDINGS: Total of 10 intraoperative saved images are submitted for review. The images demonstrate a flexible duodenal scope in the descending duodenum followed by wire cannulation of the common hepatic duct and cholangiography. Cholangiography demonstrates ductal dilation with a distal filling defect consistent with choledocholithiasis. Subsequent images document sphincterotomy and balloon sweeping of the common duct. IMPRESSION: 1. Choledocholithiasis. 2. ERCP with sphincterotomy and balloon sweeping of the common duct. These images were submitted for radiologic interpretation only. Please see the procedural report for the amount of contrast and the fluoroscopy time utilized. Electronically Signed   By: Malachy Moan M.D.   On: 05/29/2023 07:21   MR ABDOMEN MRCP W WO CONTAST  Result Date: 05/27/2023 CLINICAL DATA:  72 hours. Hgb A1c Recent Labs    05/28/23 1542  HGBA1C 7.9*   Lipid Profile No results for input(s): "CHOL", "HDL", "LDLCALC", "TRIG", "CHOLHDL", "LDLDIRECT" in the last 72 hours. Thyroid function studies No results for input(s): "TSH", "T4TOTAL", "T3FREE", "THYROIDAB" in the last 72 hours.  Invalid input(s): "FREET3" Anemia work up No results for input(s): "VITAMINB12", "FOLATE", "FERRITIN", "TIBC", "IRON", "RETICCTPCT" in the last 72 hours. Urinalysis    Component Value Date/Time   COLORURINE YELLOW 05/26/2023 1758   APPEARANCEUR HAZY (A) 05/26/2023 1758   LABSPEC 1.025 05/26/2023 1758   PHURINE 5.5 05/26/2023 1758   GLUCOSEU 100 (A) 05/26/2023 1758   GLUCOSEU >=1000 (A) 10/07/2022 0917   HGBUR SMALL (A) 05/26/2023 1758   BILIRUBINUR LARGE (A) 05/26/2023 1758   BILIRUBINUR positive 05/26/2023 1501   KETONESUR  15 (A) 05/26/2023 1758   PROTEINUR 30 (A) 05/26/2023 1758   PROTEINUR Positive 05/26/2023 1501   UROBILINOGEN 1.0 05/26/2023 1501   UROBILINOGEN 0.2 10/07/2022 0917   NITRITE NEGATIVE 05/26/2023 1758   NITRITE neg 05/26/2023 1501   LEUKOCYTESUR NEGATIVE 05/26/2023 1758   LEUKOCYTESUR Negative 05/26/2023 1501   Sepsis Labs Recent Labs  Lab 05/26/23 1447 05/26/23 1758 05/27/23 0449  WBC 5.6 5.9 5.8   Microbiology No results found for this or any previous visit (from the past 240 hour(s)).  Please note: You were cared for by a hospitalist during your hospital stay. Once you are discharged, your primary care physician will handle any further medical issues. Please note that NO REFILLS for any discharge medications will be authorized once you are discharged, as it is imperative that you return to your primary care physician (or establish a relationship with a primary care physician if you do not have one) for your post hospital discharge needs so that they can reassess your need for medications and monitor your lab values.    Time coordinating discharge: 40 minutes  SIGNED:   Burnadette Pop, MD  [facility] 05/30/2023, 4:52 PM Pager [phone]  If 7PM-7AM, please contact night-coverage [email or web address] [facility] Physician Discharge Summary  Brandy Garza GLO:[id number] DOB: 08/01/1991 DOA: 05/26/2023  PCP: Mliss Sax, MD  Admit date: 05/26/2023 Discharge date: 05/30/2023  Admitted From: Home Disposition:  Home  Discharge Condition:Stable CODE STATUS:FULL, DNR, Comfort Care Diet recommendation: Heart Healthy / Carb Modified / Regular / Dysphagia   Brief/Interim Summary:   Following problems were addressed during the hospitalization:   Discharge Diagnoses:  Principal Problem:   Biliary obstruction Active Problems:   Type 2 diabetes mellitus with hyperglycemia, without long-term current use of insulin (HCC)   Abnormal magnetic resonance  cholangiopancreatography (MRCP)   Elevated LFTs   RUQ abdominal pain    Discharge Instructions  Discharge Instructions     Diet Carb Modified   Complete by: As directed    Discharge instructions   Complete by: As directed    1)Please follow up with general surgery as an outpatient.   Increase activity slowly   Complete by: As directed       Allergies as of 05/30/2023       Reactions   Latex Itching, Other (See Comments)   burn   Flagyl [metronidazole Hcl] Rash, Other (See Comments)   Bumps all over         Medication List     TAKE these medications    ibuprofen 200 MG tablet Commonly known as: ADVIL Take 600 mg by mouth once as needed for headache or moderate pain.  Physician Discharge Summary  Brandy Garza ZOX:[id number] DOB: 08/30/1991 DOA: 05/26/2023  PCP: Mliss Sax, MD  Admit date: 05/26/2023 Discharge date: 05/30/2023  Admitted From: Home Disposition:  Home  Discharge Condition:Stable CODE STATUS:FULL, DNR, Comfort Care Diet recommendation: Heart Healthy / Carb Modified / Regular / Dysphagia   Brief/Interim Summary:   Following problems were addressed during the hospitalization:   Discharge Diagnoses:  Principal Problem:   Biliary obstruction Active Problems:   Type 2 diabetes mellitus with hyperglycemia, without long-term current use of insulin (HCC)   Abnormal magnetic resonance cholangiopancreatography (MRCP)   Elevated LFTs   RUQ abdominal pain    Discharge Instructions  Discharge Instructions     Diet Carb Modified   Complete by: As directed    Discharge instructions   Complete by: As directed    1)Please follow up with general surgery as an outpatient.   Increase activity slowly   Complete by: As directed       Allergies as of 05/30/2023       Reactions   Latex Itching, Other (See Comments)   burn   Flagyl [metronidazole Hcl] Rash, Other (See Comments)   Bumps all over         Medication List     TAKE these medications    ibuprofen 200 MG tablet Commonly known as: ADVIL Take 600 mg by mouth once as needed for headache or moderate pain.   Lantus SoloStar 100 UNIT/ML Solostar Pen Generic drug: insulin glargine Start with 20 U s.q. nightly.   levonorgestrel 20 MCG/24HR IUD Commonly known as: MIRENA 1 each by Intrauterine route once. Inserted 2011   metFORMIN 500 MG tablet Commonly known as: GLUCOPHAGE Take 1 tablet (500 mg total) by mouth 2 (two) times daily.   oxyCODONE 5 MG immediate release tablet Commonly known as: Oxy IR/ROXICODONE Take 1 tablet (5 mg total) by mouth every 4 (four) hours as needed for severe pain.        Follow-up Information     Fritzi Mandes, MD.  Schedule an appointment as soon as possible for a visit in 2 week(s).   Specialty: General Surgery Contact information: [address] [place] [address] [phone]                Allergies  Allergen Reactions   Latex Itching and Other (See Comments)    burn   Flagyl [Metronidazole Hcl] Rash and Other (See Comments)    Bumps all over     Consultations:    Procedures/Studies: DG ERCP  Result Date: 05/29/2023 CLINICAL DATA:  Choledocholithiasis EXAM: ERCP TECHNIQUE: Multiple spot images obtained with the fluoroscopic device and submitted for interpretation post-procedure. FLUOROSCOPY: Radiation Exposure Index (as provided by the fluoroscopic device): 93.2 mGy Kerma COMPARISON:  MRCP 05/27/2023 FINDINGS: Total of 10 intraoperative saved images are submitted for review. The images demonstrate a flexible duodenal scope in the descending duodenum followed by wire cannulation of the common hepatic duct and cholangiography. Cholangiography demonstrates ductal dilation with a distal filling defect consistent with choledocholithiasis. Subsequent images document sphincterotomy and balloon sweeping of the common duct. IMPRESSION: 1. Choledocholithiasis. 2. ERCP with sphincterotomy and balloon sweeping of the common duct. These images were submitted for radiologic interpretation only. Please see the procedural report for the amount of contrast and the fluoroscopy time utilized. Electronically Signed   By: Malachy Moan M.D.   On: 05/29/2023 07:21   MR ABDOMEN MRCP W WO CONTAST  Result Date: 05/27/2023 CLINICAL DATA:  72 hours. Hgb A1c Recent Labs    05/28/23 1542  HGBA1C 7.9*   Lipid Profile No results for input(s): "CHOL", "HDL", "LDLCALC", "TRIG", "CHOLHDL", "LDLDIRECT" in the last 72 hours. Thyroid function studies No results for input(s): "TSH", "T4TOTAL", "T3FREE", "THYROIDAB" in the last 72 hours.  Invalid input(s): "FREET3" Anemia work up No results for input(s): "VITAMINB12", "FOLATE", "FERRITIN", "TIBC", "IRON", "RETICCTPCT" in the last 72 hours. Urinalysis    Component Value Date/Time   COLORURINE YELLOW 05/26/2023 1758   APPEARANCEUR HAZY (A) 05/26/2023 1758   LABSPEC 1.025 05/26/2023 1758   PHURINE 5.5 05/26/2023 1758   GLUCOSEU 100 (A) 05/26/2023 1758   GLUCOSEU >=1000 (A) 10/07/2022 0917   HGBUR SMALL (A) 05/26/2023 1758   BILIRUBINUR LARGE (A) 05/26/2023 1758   BILIRUBINUR positive 05/26/2023 1501   KETONESUR  15 (A) 05/26/2023 1758   PROTEINUR 30 (A) 05/26/2023 1758   PROTEINUR Positive 05/26/2023 1501   UROBILINOGEN 1.0 05/26/2023 1501   UROBILINOGEN 0.2 10/07/2022 0917   NITRITE NEGATIVE 05/26/2023 1758   NITRITE neg 05/26/2023 1501   LEUKOCYTESUR NEGATIVE 05/26/2023 1758   LEUKOCYTESUR Negative 05/26/2023 1501   Sepsis Labs Recent Labs  Lab 05/26/23 1447 05/26/23 1758 05/27/23 0449  WBC 5.6 5.9 5.8   Microbiology No results found for this or any previous visit (from the past 240 hour(s)).  Please note: You were cared for by a hospitalist during your hospital stay. Once you are discharged, your primary care physician will handle any further medical issues. Please note that NO REFILLS for any discharge medications will be authorized once you are discharged, as it is imperative that you return to your primary care physician (or establish a relationship with a primary care physician if you do not have one) for your post hospital discharge needs so that they can reassess your need for medications and monitor your lab values.    Time coordinating discharge: 40 minutes  SIGNED:   Burnadette Pop, MD  [facility] 05/30/2023, 4:52 PM Pager [phone]  If 7PM-7AM, please contact night-coverage [email or web address] [facility] Physician Discharge Summary  Brandy Garza GLO:[id number] DOB: 08/01/1991 DOA: 05/26/2023  PCP: Mliss Sax, MD  Admit date: 05/26/2023 Discharge date: 05/30/2023  Admitted From: Home Disposition:  Home  Discharge Condition:Stable CODE STATUS:FULL, DNR, Comfort Care Diet recommendation: Heart Healthy / Carb Modified / Regular / Dysphagia   Brief/Interim Summary:   Following problems were addressed during the hospitalization:   Discharge Diagnoses:  Principal Problem:   Biliary obstruction Active Problems:   Type 2 diabetes mellitus with hyperglycemia, without long-term current use of insulin (HCC)   Abnormal magnetic resonance  cholangiopancreatography (MRCP)   Elevated LFTs   RUQ abdominal pain    Discharge Instructions  Discharge Instructions     Diet Carb Modified   Complete by: As directed    Discharge instructions   Complete by: As directed    1)Please follow up with general surgery as an outpatient.   Increase activity slowly   Complete by: As directed       Allergies as of 05/30/2023       Reactions   Latex Itching, Other (See Comments)   burn   Flagyl [metronidazole Hcl] Rash, Other (See Comments)   Bumps all over         Medication List     TAKE these medications    ibuprofen 200 MG tablet Commonly known as: ADVIL Take 600 mg by mouth once as needed for headache or moderate pain.  72 hours. Hgb A1c Recent Labs    05/28/23 1542  HGBA1C 7.9*   Lipid Profile No results for input(s): "CHOL", "HDL", "LDLCALC", "TRIG", "CHOLHDL", "LDLDIRECT" in the last 72 hours. Thyroid function studies No results for input(s): "TSH", "T4TOTAL", "T3FREE", "THYROIDAB" in the last 72 hours.  Invalid input(s): "FREET3" Anemia work up No results for input(s): "VITAMINB12", "FOLATE", "FERRITIN", "TIBC", "IRON", "RETICCTPCT" in the last 72 hours. Urinalysis    Component Value Date/Time   COLORURINE YELLOW 05/26/2023 1758   APPEARANCEUR HAZY (A) 05/26/2023 1758   LABSPEC 1.025 05/26/2023 1758   PHURINE 5.5 05/26/2023 1758   GLUCOSEU 100 (A) 05/26/2023 1758   GLUCOSEU >=1000 (A) 10/07/2022 0917   HGBUR SMALL (A) 05/26/2023 1758   BILIRUBINUR LARGE (A) 05/26/2023 1758   BILIRUBINUR positive 05/26/2023 1501   KETONESUR  15 (A) 05/26/2023 1758   PROTEINUR 30 (A) 05/26/2023 1758   PROTEINUR Positive 05/26/2023 1501   UROBILINOGEN 1.0 05/26/2023 1501   UROBILINOGEN 0.2 10/07/2022 0917   NITRITE NEGATIVE 05/26/2023 1758   NITRITE neg 05/26/2023 1501   LEUKOCYTESUR NEGATIVE 05/26/2023 1758   LEUKOCYTESUR Negative 05/26/2023 1501   Sepsis Labs Recent Labs  Lab 05/26/23 1447 05/26/23 1758 05/27/23 0449  WBC 5.6 5.9 5.8   Microbiology No results found for this or any previous visit (from the past 240 hour(s)).  Please note: You were cared for by a hospitalist during your hospital stay. Once you are discharged, your primary care physician will handle any further medical issues. Please note that NO REFILLS for any discharge medications will be authorized once you are discharged, as it is imperative that you return to your primary care physician (or establish a relationship with a primary care physician if you do not have one) for your post hospital discharge needs so that they can reassess your need for medications and monitor your lab values.    Time coordinating discharge: 40 minutes  SIGNED:   Burnadette Pop, MD  [facility] 05/30/2023, 4:52 PM Pager [phone]  If 7PM-7AM, please contact night-coverage [email or web address] [facility] Physician Discharge Summary  Brandy Garza GLO:[id number] DOB: 08/01/1991 DOA: 05/26/2023  PCP: Mliss Sax, MD  Admit date: 05/26/2023 Discharge date: 05/30/2023  Admitted From: Home Disposition:  Home  Discharge Condition:Stable CODE STATUS:FULL, DNR, Comfort Care Diet recommendation: Heart Healthy / Carb Modified / Regular / Dysphagia   Brief/Interim Summary:   Following problems were addressed during the hospitalization:   Discharge Diagnoses:  Principal Problem:   Biliary obstruction Active Problems:   Type 2 diabetes mellitus with hyperglycemia, without long-term current use of insulin (HCC)   Abnormal magnetic resonance  cholangiopancreatography (MRCP)   Elevated LFTs   RUQ abdominal pain    Discharge Instructions  Discharge Instructions     Diet Carb Modified   Complete by: As directed    Discharge instructions   Complete by: As directed    1)Please follow up with general surgery as an outpatient.   Increase activity slowly   Complete by: As directed       Allergies as of 05/30/2023       Reactions   Latex Itching, Other (See Comments)   burn   Flagyl [metronidazole Hcl] Rash, Other (See Comments)   Bumps all over         Medication List     TAKE these medications    ibuprofen 200 MG tablet Commonly known as: ADVIL Take 600 mg by mouth once as needed for headache or moderate pain.  Physician Discharge Summary  Brandy Garza ZOX:[id number] DOB: 08/30/1991 DOA: 05/26/2023  PCP: Mliss Sax, MD  Admit date: 05/26/2023 Discharge date: 05/30/2023  Admitted From: Home Disposition:  Home  Discharge Condition:Stable CODE STATUS:FULL, DNR, Comfort Care Diet recommendation: Heart Healthy / Carb Modified / Regular / Dysphagia   Brief/Interim Summary:   Following problems were addressed during the hospitalization:   Discharge Diagnoses:  Principal Problem:   Biliary obstruction Active Problems:   Type 2 diabetes mellitus with hyperglycemia, without long-term current use of insulin (HCC)   Abnormal magnetic resonance cholangiopancreatography (MRCP)   Elevated LFTs   RUQ abdominal pain    Discharge Instructions  Discharge Instructions     Diet Carb Modified   Complete by: As directed    Discharge instructions   Complete by: As directed    1)Please follow up with general surgery as an outpatient.   Increase activity slowly   Complete by: As directed       Allergies as of 05/30/2023       Reactions   Latex Itching, Other (See Comments)   burn   Flagyl [metronidazole Hcl] Rash, Other (See Comments)   Bumps all over         Medication List     TAKE these medications    ibuprofen 200 MG tablet Commonly known as: ADVIL Take 600 mg by mouth once as needed for headache or moderate pain.   Lantus SoloStar 100 UNIT/ML Solostar Pen Generic drug: insulin glargine Start with 20 U s.q. nightly.   levonorgestrel 20 MCG/24HR IUD Commonly known as: MIRENA 1 each by Intrauterine route once. Inserted 2011   metFORMIN 500 MG tablet Commonly known as: GLUCOPHAGE Take 1 tablet (500 mg total) by mouth 2 (two) times daily.   oxyCODONE 5 MG immediate release tablet Commonly known as: Oxy IR/ROXICODONE Take 1 tablet (5 mg total) by mouth every 4 (four) hours as needed for severe pain.        Follow-up Information     Fritzi Mandes, MD.  Schedule an appointment as soon as possible for a visit in 2 week(s).   Specialty: General Surgery Contact information: [address] [place] [address] [phone]                Allergies  Allergen Reactions   Latex Itching and Other (See Comments)    burn   Flagyl [Metronidazole Hcl] Rash and Other (See Comments)    Bumps all over     Consultations:    Procedures/Studies: DG ERCP  Result Date: 05/29/2023 CLINICAL DATA:  Choledocholithiasis EXAM: ERCP TECHNIQUE: Multiple spot images obtained with the fluoroscopic device and submitted for interpretation post-procedure. FLUOROSCOPY: Radiation Exposure Index (as provided by the fluoroscopic device): 93.2 mGy Kerma COMPARISON:  MRCP 05/27/2023 FINDINGS: Total of 10 intraoperative saved images are submitted for review. The images demonstrate a flexible duodenal scope in the descending duodenum followed by wire cannulation of the common hepatic duct and cholangiography. Cholangiography demonstrates ductal dilation with a distal filling defect consistent with choledocholithiasis. Subsequent images document sphincterotomy and balloon sweeping of the common duct. IMPRESSION: 1. Choledocholithiasis. 2. ERCP with sphincterotomy and balloon sweeping of the common duct. These images were submitted for radiologic interpretation only. Please see the procedural report for the amount of contrast and the fluoroscopy time utilized. Electronically Signed   By: Malachy Moan M.D.   On: 05/29/2023 07:21   MR ABDOMEN MRCP W WO CONTAST  Result Date: 05/27/2023 CLINICAL DATA:  Physician Discharge Summary  Brandy Garza ZOX:[id number] DOB: 08/30/1991 DOA: 05/26/2023  PCP: Mliss Sax, MD  Admit date: 05/26/2023 Discharge date: 05/30/2023  Admitted From: Home Disposition:  Home  Discharge Condition:Stable CODE STATUS:FULL, DNR, Comfort Care Diet recommendation: Heart Healthy / Carb Modified / Regular / Dysphagia   Brief/Interim Summary:   Following problems were addressed during the hospitalization:   Discharge Diagnoses:  Principal Problem:   Biliary obstruction Active Problems:   Type 2 diabetes mellitus with hyperglycemia, without long-term current use of insulin (HCC)   Abnormal magnetic resonance cholangiopancreatography (MRCP)   Elevated LFTs   RUQ abdominal pain    Discharge Instructions  Discharge Instructions     Diet Carb Modified   Complete by: As directed    Discharge instructions   Complete by: As directed    1)Please follow up with general surgery as an outpatient.   Increase activity slowly   Complete by: As directed       Allergies as of 05/30/2023       Reactions   Latex Itching, Other (See Comments)   burn   Flagyl [metronidazole Hcl] Rash, Other (See Comments)   Bumps all over         Medication List     TAKE these medications    ibuprofen 200 MG tablet Commonly known as: ADVIL Take 600 mg by mouth once as needed for headache or moderate pain.   Lantus SoloStar 100 UNIT/ML Solostar Pen Generic drug: insulin glargine Start with 20 U s.q. nightly.   levonorgestrel 20 MCG/24HR IUD Commonly known as: MIRENA 1 each by Intrauterine route once. Inserted 2011   metFORMIN 500 MG tablet Commonly known as: GLUCOPHAGE Take 1 tablet (500 mg total) by mouth 2 (two) times daily.   oxyCODONE 5 MG immediate release tablet Commonly known as: Oxy IR/ROXICODONE Take 1 tablet (5 mg total) by mouth every 4 (four) hours as needed for severe pain.        Follow-up Information     Fritzi Mandes, MD.  Schedule an appointment as soon as possible for a visit in 2 week(s).   Specialty: General Surgery Contact information: [address] [place] [address] [phone]                Allergies  Allergen Reactions   Latex Itching and Other (See Comments)    burn   Flagyl [Metronidazole Hcl] Rash and Other (See Comments)    Bumps all over     Consultations:    Procedures/Studies: DG ERCP  Result Date: 05/29/2023 CLINICAL DATA:  Choledocholithiasis EXAM: ERCP TECHNIQUE: Multiple spot images obtained with the fluoroscopic device and submitted for interpretation post-procedure. FLUOROSCOPY: Radiation Exposure Index (as provided by the fluoroscopic device): 93.2 mGy Kerma COMPARISON:  MRCP 05/27/2023 FINDINGS: Total of 10 intraoperative saved images are submitted for review. The images demonstrate a flexible duodenal scope in the descending duodenum followed by wire cannulation of the common hepatic duct and cholangiography. Cholangiography demonstrates ductal dilation with a distal filling defect consistent with choledocholithiasis. Subsequent images document sphincterotomy and balloon sweeping of the common duct. IMPRESSION: 1. Choledocholithiasis. 2. ERCP with sphincterotomy and balloon sweeping of the common duct. These images were submitted for radiologic interpretation only. Please see the procedural report for the amount of contrast and the fluoroscopy time utilized. Electronically Signed   By: Malachy Moan M.D.   On: 05/29/2023 07:21   MR ABDOMEN MRCP W WO CONTAST  Result Date: 05/27/2023 CLINICAL DATA:  72 hours. Hgb A1c Recent Labs    05/28/23 1542  HGBA1C 7.9*   Lipid Profile No results for input(s): "CHOL", "HDL", "LDLCALC", "TRIG", "CHOLHDL", "LDLDIRECT" in the last 72 hours. Thyroid function studies No results for input(s): "TSH", "T4TOTAL", "T3FREE", "THYROIDAB" in the last 72 hours.  Invalid input(s): "FREET3" Anemia work up No results for input(s): "VITAMINB12", "FOLATE", "FERRITIN", "TIBC", "IRON", "RETICCTPCT" in the last 72 hours. Urinalysis    Component Value Date/Time   COLORURINE YELLOW 05/26/2023 1758   APPEARANCEUR HAZY (A) 05/26/2023 1758   LABSPEC 1.025 05/26/2023 1758   PHURINE 5.5 05/26/2023 1758   GLUCOSEU 100 (A) 05/26/2023 1758   GLUCOSEU >=1000 (A) 10/07/2022 0917   HGBUR SMALL (A) 05/26/2023 1758   BILIRUBINUR LARGE (A) 05/26/2023 1758   BILIRUBINUR positive 05/26/2023 1501   KETONESUR  15 (A) 05/26/2023 1758   PROTEINUR 30 (A) 05/26/2023 1758   PROTEINUR Positive 05/26/2023 1501   UROBILINOGEN 1.0 05/26/2023 1501   UROBILINOGEN 0.2 10/07/2022 0917   NITRITE NEGATIVE 05/26/2023 1758   NITRITE neg 05/26/2023 1501   LEUKOCYTESUR NEGATIVE 05/26/2023 1758   LEUKOCYTESUR Negative 05/26/2023 1501   Sepsis Labs Recent Labs  Lab 05/26/23 1447 05/26/23 1758 05/27/23 0449  WBC 5.6 5.9 5.8   Microbiology No results found for this or any previous visit (from the past 240 hour(s)).  Please note: You were cared for by a hospitalist during your hospital stay. Once you are discharged, your primary care physician will handle any further medical issues. Please note that NO REFILLS for any discharge medications will be authorized once you are discharged, as it is imperative that you return to your primary care physician (or establish a relationship with a primary care physician if you do not have one) for your post hospital discharge needs so that they can reassess your need for medications and monitor your lab values.    Time coordinating discharge: 40 minutes  SIGNED:   Burnadette Pop, MD  [facility] 05/30/2023, 4:52 PM Pager [phone]  If 7PM-7AM, please contact night-coverage [email or web address] [facility] Physician Discharge Summary  Brandy Garza GLO:[id number] DOB: 08/01/1991 DOA: 05/26/2023  PCP: Mliss Sax, MD  Admit date: 05/26/2023 Discharge date: 05/30/2023  Admitted From: Home Disposition:  Home  Discharge Condition:Stable CODE STATUS:FULL, DNR, Comfort Care Diet recommendation: Heart Healthy / Carb Modified / Regular / Dysphagia   Brief/Interim Summary:   Following problems were addressed during the hospitalization:   Discharge Diagnoses:  Principal Problem:   Biliary obstruction Active Problems:   Type 2 diabetes mellitus with hyperglycemia, without long-term current use of insulin (HCC)   Abnormal magnetic resonance  cholangiopancreatography (MRCP)   Elevated LFTs   RUQ abdominal pain    Discharge Instructions  Discharge Instructions     Diet Carb Modified   Complete by: As directed    Discharge instructions   Complete by: As directed    1)Please follow up with general surgery as an outpatient.   Increase activity slowly   Complete by: As directed       Allergies as of 05/30/2023       Reactions   Latex Itching, Other (See Comments)   burn   Flagyl [metronidazole Hcl] Rash, Other (See Comments)   Bumps all over         Medication List     TAKE these medications    ibuprofen 200 MG tablet Commonly known as: ADVIL Take 600 mg by mouth once as needed for headache or moderate pain.

## 2023-05-30 NOTE — Anesthesia Procedure Notes (Signed)
Procedure Name: Intubation Date/Time: 05/30/2023 7:56 AM  Performed by: Rachel Moulds, CRNAPre-anesthesia Checklist: Patient identified, Emergency Drugs available, Suction available, Patient being monitored and Timeout performed Patient Re-evaluated:Patient Re-evaluated prior to induction Oxygen Delivery Method: Circle system utilized Preoxygenation: Pre-oxygenation with 100% oxygen Induction Type: IV induction Ventilation: Mask ventilation without difficulty Laryngoscope Size: Mac and 3 Grade View: Grade I Tube type: Oral Tube size: 7.0 mm Number of attempts: 1 Airway Equipment and Method: Stylet Placement Confirmation: ETT inserted through vocal cords under direct vision, positive ETCO2, CO2 detector and breath sounds checked- equal and bilateral Secured at: 22 cm Tube secured with: Tape Dental Injury: Teeth and Oropharynx as per pre-operative assessment

## 2023-05-30 NOTE — Progress Notes (Signed)
Day of Surgery  Subjective: No abdominal pain this morning. Feels well.  ROS: See above, otherwise other systems negative  Objective: Vital signs in last 24 hours: Temp:  [97.5 F (36.4 C)-98.5 F (36.9 C)] 98 F (36.7 C) (09/22 0727) Pulse Rate:  [70-89] 81 (09/22 0727) Resp:  [16-18] 16 (09/22 0727) BP: (90-121)/(46-95) 121/95 (09/22 0727) SpO2:  [98 %-100 %] 99 % (09/22 0727) Last BM Date : 05/26/23  Intake/Output from previous day: No intake/output data recorded. Intake/Output this shift: No intake/output data recorded.  PE: General: resting comfortably, NAD Neuro: alert and oriented, no focal deficits Resp: normal work of breathing on room air Abdomen: soft, nondistended, nontender to palpation Extremities: warm and well-perfused   Lab Results:  No results for input(s): "WBC", "HGB", "HCT", "PLT" in the last 72 hours. BMET Recent Labs    05/28/23 1036 05/29/23 0501  NA 136 135  K 3.6 3.8  CL 105 105  CO2 23 23  GLUCOSE 125* 119*  BUN 5* 7  CREATININE 0.73 0.78  CALCIUM 8.4* 8.7*   PT/INR Recent Labs    05/27/23 1106  LABPROT 13.5  INR 1.0   CMP     Component Value Date/Time   NA 135 05/29/2023 0501   K 3.8 05/29/2023 0501   CL 105 05/29/2023 0501   CO2 23 05/29/2023 0501   GLUCOSE 119 (H) 05/29/2023 0501   BUN 7 05/29/2023 0501   CREATININE 0.78 05/29/2023 0501   CALCIUM 8.7 (L) 05/29/2023 0501   PROT 6.9 05/29/2023 0501   ALBUMIN 3.3 (L) 05/29/2023 0501   AST 70 (H) 05/29/2023 0501   ALT 211 (H) 05/29/2023 0501   ALKPHOS 144 (H) 05/29/2023 0501   BILITOT 1.8 (H) 05/29/2023 0501   GFRNONAA >60 05/29/2023 0501   GFRAA >60 10/28/2017 1636   Lipase     Component Value Date/Time   LIPASE 22 05/26/2023 1758       Studies/Results: DG ERCP  Result Date: 05/29/2023 CLINICAL DATA:  Choledocholithiasis EXAM: ERCP TECHNIQUE: Multiple spot images obtained with the fluoroscopic device and submitted for interpretation post-procedure.  FLUOROSCOPY: Radiation Exposure Index (as provided by the fluoroscopic device): 93.2 mGy Kerma COMPARISON:  MRCP 05/27/2023 FINDINGS: Total of 10 intraoperative saved images are submitted for review. The images demonstrate a flexible duodenal scope in the descending duodenum followed by wire cannulation of the common hepatic duct and cholangiography. Cholangiography demonstrates ductal dilation with a distal filling defect consistent with choledocholithiasis. Subsequent images document sphincterotomy and balloon sweeping of the common duct. IMPRESSION: 1. Choledocholithiasis. 2. ERCP with sphincterotomy and balloon sweeping of the common duct. These images were submitted for radiologic interpretation only. Please see the procedural report for the amount of contrast and the fluoroscopy time utilized. Electronically Signed   By: Malachy Moan M.D.   On: 05/29/2023 07:21    Anti-infectives: Anti-infectives (From admission, onward)    Start     Dose/Rate Route Frequency Ordered Stop   05/29/23 0600  cefTRIAXone (ROCEPHIN) 2 g in sodium chloride 0.9 % 100 mL IVPB       Note to Pharmacy: Pharmacy may adjust dosing strength / duration / interval for maximal efficacy   2 g 200 mL/hr over 30 Minutes Intravenous On call to O.R. 05/28/23 1542 05/30/23 0559   05/28/23 1015  Ampicillin-Sulbactam (UNASYN) 3 g in sodium chloride 0.9 % 100 mL IVPB        3 g 200 mL/hr over 30 Minutes Intravenous  Once 05/28/23 0920  05/28/23 1256        Assessment/Plan 32 yo female with choledocholithiasis, s/p ERCP. LFTs downtrending. Proceed to OR today for laparoscopic cholecystectomy. I reviewed the procedure details with the patient including the benefits and risks of bleeding, infection, and damage to surrounding structures. She expressed understanding and agrees to proceed. All questions were answered.    LOS: 3 days    Sophronia Simas, MD [facility] Surgery General, Hepatobiliary and Pancreatic  Surgery 05/30/23 7:35 AM

## 2023-05-30 NOTE — Anesthesia Preprocedure Evaluation (Signed)
Anesthesia Evaluation  Patient identified by MRN, date of birth, ID band Patient awake    Reviewed: Allergy & Precautions, H&P , NPO status , Patient's Chart, lab work & pertinent test results  Airway Mallampati: II   Neck ROM: full    Dental   Pulmonary former smoker   breath sounds clear to auscultation       Cardiovascular negative cardio ROS  Rhythm:regular Rate:Normal     Neuro/Psych    GI/Hepatic   Endo/Other  diabetes, Type 2    Renal/GU      Musculoskeletal   Abdominal   Peds  Hematology   Anesthesia Other Findings   Reproductive/Obstetrics                             Anesthesia Physical Anesthesia Plan  ASA: 2  Anesthesia Plan: General   Post-op Pain Management:    Induction: Intravenous  PONV Risk Score and Plan: 3 and Ondansetron, Dexamethasone, Midazolam and Treatment may vary due to age or medical condition  Airway Management Planned: Oral ETT  Additional Equipment:   Intra-op Plan:   Post-operative Plan: Extubation in OR  Informed Consent: I have reviewed the patients History and Physical, chart, labs and discussed the procedure including the risks, benefits and alternatives for the proposed anesthesia with the patient or authorized representative who has indicated his/her understanding and acceptance.     Dental advisory given  Plan Discussed with: CRNA, Anesthesiologist and Surgeon  Anesthesia Plan Comments:        Anesthesia Quick Evaluation

## 2023-05-30 NOTE — Op Note (Signed)
Date: 05/30/23  Patient: Brandy Garza MRN: [id number]  Preoperative Diagnosis: Cholelithiasis Postoperative Diagnosis: Same  Procedure: Laparoscopic cholecystectomy  Surgeon: Sophronia Simas, MD  EBL: Minimal  Anesthesia: General endotracheal  Specimens: Gallbladder  Indications: [name] is a 32 yo female who presented with RUQ pain, nausea/vomiting, and elevated LFTs. An MRI showed biliary ductal dilation. She underwent an ERCP with biliary sphincterotomy and extraction of an obstructing stone from the common bile duct two days ago. LFTs have since downtrended. After a discussion of the risks and benefits of surgery, she agreed to proceed with cholecystectomy.  Findings: Findings of chronic cholecystitis without signs of acute cholecystitis.  Procedure details: Informed consent was obtained in the preoperative area prior to the procedure. The patient was brought to the operating room and placed on the table in the supine position. General anesthesia was induced and appropriate lines and drains were placed for intraoperative monitoring. Perioperative antibiotics were administered per SCIP guidelines. The abdomen was prepped and draped in the usual sterile fashion. A pre-procedure timeout was taken verifying patient identity, surgical site and procedure to be performed.  A small supraumbilical skin incision was made, the subcutaneous tissue was bluntly spread, and the umbilical stalk was grasped and elevated. A Veress needle was inserted through the fascia, intraperitoneal placement was confirmed with the saline drop test, and the abdomen was insufflated. A 12mm trocar was placed and the peritoneal cavity was inspected with no evidence of visceral or vascular injury. Three 5mm ports were placed in the right subcostal margin, all under direct visualization. The fundus of the gallbladder was grasped and retracted cephalad. The infundibulum was retracted laterally. The peritoneum overlying the  gallbladder was incised and the cystic triangle was dissected out using cautery and blunt dissection. The critical view of safety was obtained. The cystic duct and cystic artery were clipped and ligated, leaving two clips behind on the cystic duct stump. The gallbladder was taken off the liver using cautery, and the specimen was placed in an endocatch bag. The surgical site was irrigated with saline until the effluent was clear. The gallbladder fossa appeared hemostatic. The cystic duct and artery stumps were visually inspected and there was no evidence of bile leak or bleeding. The ports were removed under direct visualization and the abdomen was desufflated. The specimen was extracted via the umbilical port site. The umbilical port site fascia was closed with a 0 vicryl figure-of-eight suture. The skin at all port sites was closed with 4-0 monocryl subcuticular suture. Dermabond was applied.  The patient tolerated the procedure well with no apparent complications. All counts were correct x2 at the end of the procedure. The patient was extubated and taken to PACU in stable condition.  Sophronia Simas, MD 05/30/23 8:48 AM

## 2023-05-30 NOTE — Anesthesia Postprocedure Evaluation (Signed)
Anesthesia Post Note  Patient: Occupational psychologist  Procedure(s) Performed: LAPAROSCOPIC CHOLECYSTECTOMY INDOCYANINE GREEN FLUORESCENCE IMAGING (ICG) (Abdomen)     Patient location during evaluation: PACU Anesthesia Type: General Level of consciousness: awake and alert Pain management: pain level controlled Vital Signs Assessment: post-procedure vital signs reviewed and stable Respiratory status: spontaneous breathing, nonlabored ventilation, respiratory function stable and patient connected to nasal cannula oxygen Cardiovascular status: blood pressure returned to baseline and stable Postop Assessment: no apparent nausea or vomiting Anesthetic complications: no   No notable events documented.  Last Vitals:  Vitals:   05/30/23 1000 05/30/23 1015  BP: 119/77 118/81  Pulse: 63 61  Resp: 12 13  Temp:  36.4 C  SpO2: 100% 100%    Last Pain:  Vitals:   05/30/23 0904  TempSrc:   PainSc: 7                  Brandy Garza S

## 2023-05-30 NOTE — Progress Notes (Signed)
PROGRESS NOTE  Brandy Garza  ZOX:[id number] DOB: 1990-10-13 DOA: 05/26/2023 PCP: Mliss Sax, MD   Brief Narrative: Patient is a 31 year old female with history of type 2 diabetes, morbid obesity who presented with right upper quadrant pain, nausea, vomiting, diarrhea.  On presentation she was hemodynamically stable.  Lab work showed elevated liver enzymes, elevated bilirubin.  No leukocytosis or elevated lipase.  Right upper quadrant ultrasound showed dilated CBD of 13 mm , positive sonographic Murphy sign without gallstones or gallbladder wall thickening or pericholecystic fluid.  GI consulted.  MRCP showed intrahepatic, extrahepatic biliary duct dilation with tapering at distal CBD.  Patient underwent ERCP , found to have a stone and sludge causing obstruction, moderate dilation of CBD s/p biliary sphincterotomy and balloon extraction.  General surgery being consulted for consideration of cholecystectomy.  S/p cholecystectomy today  Assessment & Plan:  Principal Problem:   Biliary obstruction Active Problems:   Type 2 diabetes mellitus with hyperglycemia, without long-term current use of insulin (HCC)   Abnormal magnetic resonance cholangiopancreatography (MRCP)   Elevated LFTs   RUQ abdominal pain   Abdominal pain/biliary obstruction: Presented with right upper quadrant pain, nausea, vomiting.  Lab work showed elevated liver enzymes, elevated bilirubin.  No leukocytosis or elevated lipase.  Right upper quadrant ultrasound showed 13 mm CBD, positive sonographic Murphy sign without gallstones or gallbladder wall thickening or pericholecystic fluid.  GI consulted.  Patient underwent ERCP , found to have a stone and sludge causing obstruction, moderate dilation of CBD s/p biliary sphincterotomy and balloon extraction.  General surgery  did cholecystectomy today  Type 2 diabetes: Recent A1c of 8.1.  Currently on sliding scale  insulin.  Morbid obesity: BMI 41.5        DVT  prophylaxis:SCDs Start: 05/27/23 0441     Code Status: Full Code  Family Communication: None at bedside  Patient status:Inpatient  Patient is from :home  Anticipated discharge WJ:XBJY  Estimated DC date:today or tomorrow  Consultants: GI, general surgery  Procedures: ERCP  Antimicrobials:  Anti-infectives (From admission, onward)    Start     Dose/Rate Route Frequency Ordered Stop   05/29/23 0600  cefTRIAXone (ROCEPHIN) 2 g in sodium chloride 0.9 % 100 mL IVPB       Note to Pharmacy: Pharmacy may adjust dosing strength / duration / interval for maximal efficacy   2 g 200 mL/hr over 30 Minutes Intravenous On call to O.R. 05/28/23 1542 05/30/23 0559   05/28/23 1015  Ampicillin-Sulbactam (UNASYN) 3 g in sodium chloride 0.9 % 100 mL IVPB        3 g 200 mL/hr over 30 Minutes Intravenous  Once 05/28/23 0920 05/28/23 1256       Subjective:  Patient seen and examined at bedside today at PACU.  Lightly sedated.  Denies any significant pain.  Not in any kind of distress  Objective: Vitals:   05/30/23 0945 05/30/23 1000 05/30/23 1015 05/30/23 1039  BP: 125/82 119/77 118/81 126/81  Pulse: (!) 59 63 61 71  Resp: 10 12 13 16   Temp:   97.6 F (36.4 C) 98.2 F (36.8 C)  TempSrc:    Oral  SpO2: 100% 100% 100% 100%  Weight:      Height:        Intake/Output Summary (Last 24 hours) at 05/30/2023 1112 Last data filed at 05/30/2023 0840 Gross per 24 hour  Intake 600 ml  Output 25 ml  Net 575 ml   Filed Weights   05/26/23 1752 05/27/23  0429  Weight: 109.8 kg 118.4 kg    Examination:   General exam: Overall comfortable, not in distress,obese HEENT: PERRL Respiratory system:  no wheezes or crackles  Cardiovascular system: S1 & S2 heard, RRR.  Gastrointestinal system: Abdomen is nondistended, soft and nontender.  Lap chole surgical wounds Central nervous system: Alert and oriented Extremities: No edema, no clubbing ,no cyanosis Skin: No rashes, no ulcers,no icterus     Data Reviewed: I have personally reviewed following labs and imaging studies  CBC: Recent Labs  Lab 05/26/23 1447 05/26/23 1758 05/27/23 0449  WBC 5.6 5.9 5.8  NEUTROABS 3.9 4.3  --   HGB 13.4 13.6 12.3  HCT 41.7 41.7 38.9  MCV 79.9 79.9* 80.2  PLT 317.0 325 304   Basic Metabolic Panel: Recent Labs  Lab 05/26/23 1447 05/26/23 1758 05/27/23 0449 05/28/23 1036 05/29/23 0501  NA 138 136 136 136 135  K 3.6 3.3* 3.7 3.6 3.8  CL 100 99 101 105 105  CO2 30 25 25 23 23   GLUCOSE 116* 131* 123* 125* 119*  BUN 10 12 8  5* 7  CREATININE 0.77 0.81 0.76 0.73 0.78  CALCIUM 9.2 8.9 8.9 8.4* 8.7*  MG  --   --  2.3  --   --      No results found for this or any previous visit (from the past 240 hour(s)).   Radiology Studies: DG ERCP  Result Date: 05/29/2023 CLINICAL DATA:  Choledocholithiasis EXAM: ERCP TECHNIQUE: Multiple spot images obtained with the fluoroscopic device and submitted for interpretation post-procedure. FLUOROSCOPY: Radiation Exposure Index (as provided by the fluoroscopic device): 93.2 mGy Kerma COMPARISON:  MRCP 05/27/2023 FINDINGS: Total of 10 intraoperative saved images are submitted for review. The images demonstrate a flexible duodenal scope in the descending duodenum followed by wire cannulation of the common hepatic duct and cholangiography. Cholangiography demonstrates ductal dilation with a distal filling defect consistent with choledocholithiasis. Subsequent images document sphincterotomy and balloon sweeping of the common duct. IMPRESSION: 1. Choledocholithiasis. 2. ERCP with sphincterotomy and balloon sweeping of the common duct. These images were submitted for radiologic interpretation only. Please see the procedural report for the amount of contrast and the fluoroscopy time utilized. Electronically Signed   By: Malachy Moan M.D.   On: 05/29/2023 07:21    Scheduled Meds:  acetaminophen  1,000 mg Oral TID   fentaNYL       insulin aspart  0-6 Units  Subcutaneous Q4H   Continuous Infusions:  lactated ringers 10 mL/hr at 05/30/23 0734     LOS: 3 days   Burnadette Pop, MD [facility] P9/22/2024, 11:12 AM

## 2023-05-31 ENCOUNTER — Encounter (HOSPITAL_COMMUNITY): Payer: Self-pay | Admitting: Surgery

## 2023-06-01 LAB — SURGICAL PATHOLOGY

## 2023-06-02 ENCOUNTER — Telehealth: Payer: Self-pay | Admitting: Family Medicine

## 2023-06-02 NOTE — Telephone Encounter (Signed)
Pt was admitted to [facility] for Biliary obstruction, I made her a hosp f/up with Dr Doreene Burke for 06/07/23.

## 2023-06-07 ENCOUNTER — Encounter: Payer: Self-pay | Admitting: Family Medicine

## 2023-06-07 ENCOUNTER — Ambulatory Visit (INDEPENDENT_AMBULATORY_CARE_PROVIDER_SITE_OTHER): Payer: Medicaid Other | Admitting: Family Medicine

## 2023-06-07 VITALS — BP 124/76 | HR 88 | Temp 97.6°F | Ht 66.0 in | Wt 264.6 lb

## 2023-06-07 DIAGNOSIS — E7800 Pure hypercholesterolemia, unspecified: Secondary | ICD-10-CM | POA: Insufficient documentation

## 2023-06-07 DIAGNOSIS — E1165 Type 2 diabetes mellitus with hyperglycemia: Secondary | ICD-10-CM | POA: Diagnosis not present

## 2023-06-07 DIAGNOSIS — Z09 Encounter for follow-up examination after completed treatment for conditions other than malignant neoplasm: Secondary | ICD-10-CM | POA: Diagnosis not present

## 2023-06-07 NOTE — Progress Notes (Signed)
Established Patient Office Visit   Subjective:  Patient ID: Brandy Garza, female    DOB: 05/03/1991  Age: 32 y.o. MRN: [id number]  Chief Complaint  Patient presents with   Hospitalization Follow-up    HPI Encounter Diagnoses  Name Primary?   Type 2 diabetes mellitus with hyperglycemia, without long-term current use of insulin (HCC) Yes   Hospital discharge follow-up    Elevated LDL cholesterol level    For hospital discharge follow-up status post cholecystectomy.  Doing much better.  Pain is gone.  There is no nausea vomiting fever or chills.  A1c was measured at 7.9.  15 units of Lantus nightly is keeping her a.m. sugars less than 140.  Continues with metformin 500 twice daily.  She is exercising by walking.  She continues to work from home.  With her recent hospital cessation for 51-year-old son is now weaned.   Review of Systems  Constitutional: Negative.   HENT: Negative.    Eyes:  Negative for blurred vision, discharge and redness.  Respiratory: Negative.    Cardiovascular: Negative.   Gastrointestinal:  Negative for abdominal pain, nausea and vomiting.  Genitourinary: Negative.   Musculoskeletal: Negative.  Negative for myalgias.  Skin:  Negative for rash.  Neurological:  Negative for tingling, loss of consciousness and weakness.  Endo/Heme/Allergies:  Negative for polydipsia.     Current Outpatient Medications:    ibuprofen (ADVIL,MOTRIN) 200 MG tablet, Take 600 mg by mouth once as needed for headache or moderate pain., Disp: , Rfl:    insulin glargine (LANTUS SOLOSTAR) 100 UNIT/ML Solostar Pen, Start with 20 U s.q. nightly., Disp: 15 mL, Rfl: 11   levonorgestrel (MIRENA) 20 MCG/24HR IUD, 1 each by Intrauterine route once. Inserted 2011, Disp: , Rfl:    metFORMIN (GLUCOPHAGE) 500 MG tablet, Take 1 tablet (500 mg total) by mouth 2 (two) times daily., Disp: 180 tablet, Rfl: 0   oxyCODONE (OXY IR/ROXICODONE) 5 MG immediate release tablet, Take 1 tablet (5 mg total) by  mouth every 4 (four) hours as needed for severe pain., Disp: 15 tablet, Rfl: 0   Objective:     BP 124/76   Pulse 88   Temp 97.6 F (36.4 C)   Ht 5\' 6"  (1.676 m)   Wt 264 lb 9.6 oz (120 kg)   LMP  (LMP Unknown)   SpO2 99%   BMI 42.71 kg/m    Physical Exam Constitutional:      General: She is not in acute distress.    Appearance: Normal appearance. She is not ill-appearing, toxic-appearing or diaphoretic.  HENT:     Head: Normocephalic and atraumatic.     Right Ear: External ear normal.     Left Ear: External ear normal.     Mouth/Throat:     Mouth: Mucous membranes are moist.     Pharynx: Oropharynx is clear. No oropharyngeal exudate or posterior oropharyngeal erythema.  Eyes:     General: No scleral icterus.       Right eye: No discharge.        Left eye: No discharge.     Extraocular Movements: Extraocular movements intact.     Conjunctiva/sclera: Conjunctivae normal.     Pupils: Pupils are equal, round, and reactive to light.  Cardiovascular:     Rate and Rhythm: Normal rate and regular rhythm.  Pulmonary:     Effort: Pulmonary effort is normal. No respiratory distress.     Breath sounds: Normal breath sounds.  Abdominal:  General: Bowel sounds are normal.     Tenderness: There is no abdominal tenderness. There is no guarding or rebound.    Musculoskeletal:     Cervical back: No rigidity or tenderness.  Skin:    General: Skin is warm and dry.  Neurological:     Mental Status: She is alert and oriented to person, place, and time.  Psychiatric:        Mood and Affect: Mood normal.        Behavior: Behavior normal.    Diabetic Foot Exam - Simple   Simple Foot Form Diabetic Foot exam was performed with the following findings: Yes 06/07/2023 11:25 AM  Visual Inspection No deformities, no ulcerations, no other skin breakdown bilaterally: Yes Sensation Testing Intact to touch and monofilament testing bilaterally: Yes Pulse Check Posterior Tibialis and  Dorsalis pulse intact bilaterally: Yes Comments Feet are pes planus bilaterally.  Capillary refill is brisk.       No results found for any visits on 06/07/23.    The ASCVD Risk score (Arnett DK, et al., 2019) failed to calculate for the following reasons:   The 2019 ASCVD risk score is only valid for ages 67 to 63    Assessment & Plan:   Type 2 diabetes mellitus with hyperglycemia, without long-term current use of insulin [facility])  Hospital discharge follow-up  Elevated LDL cholesterol level    Return in about 14 weeks (around 09/13/2023).  Information was given on the Mediterranean diet to help lower cholesterol.  Encouraged ongoing exercise.  Mliss Sax, MD

## 2023-06-10 ENCOUNTER — Emergency Department (HOSPITAL_COMMUNITY): Payer: Medicaid Other

## 2023-06-10 ENCOUNTER — Other Ambulatory Visit: Payer: Self-pay

## 2023-06-10 ENCOUNTER — Encounter (HOSPITAL_COMMUNITY): Payer: Self-pay

## 2023-06-10 ENCOUNTER — Emergency Department (HOSPITAL_COMMUNITY)
Admission: EM | Admit: 2023-06-10 | Discharge: 2023-06-10 | Disposition: A | Discharge status: Home / Self Care | Payer: Medicaid Other | Attending: Emergency Medicine | Admitting: Emergency Medicine

## 2023-06-10 DIAGNOSIS — Z91040 Latex allergy status: Secondary | ICD-10-CM | POA: Diagnosis not present

## 2023-06-10 DIAGNOSIS — Z794 Long term (current) use of insulin: Secondary | ICD-10-CM | POA: Insufficient documentation

## 2023-06-10 DIAGNOSIS — M7918 Myalgia, other site: Secondary | ICD-10-CM | POA: Diagnosis present

## 2023-06-10 DIAGNOSIS — Y92410 Unspecified street and highway as the place of occurrence of the external cause: Secondary | ICD-10-CM | POA: Diagnosis not present

## 2023-06-10 DIAGNOSIS — R9389 Abnormal findings on diagnostic imaging of other specified body structures: Secondary | ICD-10-CM | POA: Diagnosis not present

## 2023-06-10 LAB — CBG MONITORING, ED: Glucose-Capillary: 109 mg/dL — ABNORMAL HIGH (ref 70–99)

## 2023-06-10 MED ORDER — ACETAMINOPHEN 325 MG PO TABS
650.0000 mg | ORAL_TABLET | Freq: Once | ORAL | Status: AC
  Administered 2023-06-10: 650 mg via ORAL
  Filled 2023-06-10: qty 2

## 2023-06-10 MED ORDER — METHOCARBAMOL 500 MG PO TABS: 500.0000 mg | ORAL_TABLET | Freq: Two times a day (BID) | ORAL | 0 refills | Status: DC
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
## 2023-06-14 ENCOUNTER — Other Ambulatory Visit (HOSPITAL_BASED_OUTPATIENT_CLINIC_OR_DEPARTMENT_OTHER): Payer: Self-pay

## 2023-06-14 ENCOUNTER — Encounter: Payer: Self-pay | Admitting: Family Medicine

## 2023-06-14 ENCOUNTER — Ambulatory Visit (INDEPENDENT_AMBULATORY_CARE_PROVIDER_SITE_OTHER): Payer: Medicaid Other | Admitting: Family Medicine

## 2023-06-14 VITALS — BP 124/86 | HR 88 | Temp 98.0°F | Ht 66.0 in | Wt 263.0 lb

## 2023-06-14 DIAGNOSIS — S39012A Strain of muscle, fascia and tendon of lower back, initial encounter: Secondary | ICD-10-CM | POA: Diagnosis not present

## 2023-06-14 DIAGNOSIS — G939 Disorder of brain, unspecified: Secondary | ICD-10-CM | POA: Diagnosis not present

## 2023-06-14 DIAGNOSIS — S161XXA Strain of muscle, fascia and tendon at neck level, initial encounter: Secondary | ICD-10-CM | POA: Insufficient documentation

## 2023-06-14 DIAGNOSIS — S29019A Strain of muscle and tendon of unspecified wall of thorax, initial encounter: Secondary | ICD-10-CM | POA: Insufficient documentation

## 2023-06-14 MED ORDER — MELOXICAM 7.5 MG PO TABS
7.5000 mg | ORAL_TABLET | Freq: Every day | ORAL | 0 refills | Status: DC
Start: 2023-06-14 — End: 2023-06-16
  Filled 2023-06-14: qty 30, 30d supply, fill #0

## 2023-06-14 MED ORDER — METHOCARBAMOL 500 MG PO TABS
500.0000 mg | ORAL_TABLET | Freq: Three times a day (TID) | ORAL | 0 refills | Status: DC | PRN
Start: 2023-06-14 — End: 2023-06-15
  Filled 2023-06-14: qty 30, 10d supply, fill #0
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
## 2023-06-15 ENCOUNTER — Telehealth: Payer: Self-pay | Admitting: Family Medicine

## 2023-06-15 ENCOUNTER — Other Ambulatory Visit: Payer: Self-pay

## 2023-06-15 ENCOUNTER — Other Ambulatory Visit (HOSPITAL_BASED_OUTPATIENT_CLINIC_OR_DEPARTMENT_OTHER): Payer: Self-pay

## 2023-06-15 DIAGNOSIS — S161XXA Strain of muscle, fascia and tendon at neck level, initial encounter: Secondary | ICD-10-CM

## 2023-06-15 DIAGNOSIS — S39012A Strain of muscle, fascia and tendon of lower back, initial encounter: Secondary | ICD-10-CM

## 2023-06-15 DIAGNOSIS — S29019A Strain of muscle and tendon of unspecified wall of thorax, initial encounter: Secondary | ICD-10-CM

## 2023-06-15 MED ORDER — METHOCARBAMOL 500 MG PO TABS: 500.0000 mg | ORAL_TABLET | Freq: Three times a day (TID) | ORAL | 0 refills | Status: DC | PRN

## 2023-06-15 NOTE — Telephone Encounter (Signed)
Refills for   Rx #: [id number] methocarbamol (ROBAXIN) 500 MG tablet [[id number]]  Rx #: [id number]  meloxicam (MOBIC) 7.5 MG tablet [[id number]]   Was sent to [facility].  She wants these resent to  [facility][id number] [place], [place] - [id number] AT [facility] OF [facility] & [address] [place] [address] Phone: [phone]  Fax: [phone]    Please send all her meds to the [facility]

## 2023-06-16 ENCOUNTER — Ambulatory Visit (INDEPENDENT_AMBULATORY_CARE_PROVIDER_SITE_OTHER)
Admission: RE | Admit: 2023-06-16 | Discharge: 2023-06-16 | Disposition: A | Discharge status: Home / Self Care | Payer: Medicaid Other | Source: Ambulatory Visit | Attending: Family Medicine | Admitting: Family Medicine

## 2023-06-16 ENCOUNTER — Other Ambulatory Visit (HOSPITAL_BASED_OUTPATIENT_CLINIC_OR_DEPARTMENT_OTHER): Payer: Self-pay

## 2023-06-16 ENCOUNTER — Encounter: Payer: Self-pay | Admitting: Family Medicine

## 2023-06-16 DIAGNOSIS — S29019A Strain of muscle and tendon of unspecified wall of thorax, initial encounter: Secondary | ICD-10-CM

## 2023-06-16 DIAGNOSIS — S39012A Strain of muscle, fascia and tendon of lower back, initial encounter: Secondary | ICD-10-CM | POA: Diagnosis not present

## 2023-06-16 MED ORDER — MELOXICAM 7.5 MG PO TABS: 7.5000 mg | ORAL_TABLET | Freq: Every day | ORAL | 0 refills | Status: DC

## 2023-06-16 NOTE — Telephone Encounter (Signed)
Rx #: [id number]  meloxicam (MOBIC) 7.5 MG tablet [[id number]]  Also needs to be sent to    [facility] #[id number] [place], [place] - [address] AT [facility] OF [facility] & [address] Phone: [phone]  Fax: [phone]

## 2023-06-16 NOTE — Addendum Note (Signed)
Addended by: Mickle Plumb L on: 06/16/2023 11:58 AM   Modules accepted: Orders

## 2023-06-16 NOTE — Telephone Encounter (Signed)
Rx resent to the [facility] patient requested. Dm/cma

## 2023-06-21 ENCOUNTER — Encounter: Payer: Self-pay | Admitting: Family Medicine

## 2023-06-22 ENCOUNTER — Other Ambulatory Visit: Payer: Medicaid Other

## 2023-06-23 ENCOUNTER — Other Ambulatory Visit (HOSPITAL_BASED_OUTPATIENT_CLINIC_OR_DEPARTMENT_OTHER): Payer: Self-pay

## 2023-06-24 NOTE — Addendum Note (Signed)
Addended by: Nadene Rubins A on: 06/24/2023 12:12 PM   Modules accepted: Orders

## 2023-06-28 ENCOUNTER — Ambulatory Visit: Payer: Medicaid Other | Admitting: Family Medicine

## 2023-06-28 ENCOUNTER — Encounter: Payer: Self-pay | Admitting: Family Medicine

## 2023-06-28 VITALS — BP 120/84 | HR 93 | Temp 97.9°F | Ht 66.0 in | Wt 272.4 lb

## 2023-06-28 DIAGNOSIS — S29019A Strain of muscle and tendon of unspecified wall of thorax, initial encounter: Secondary | ICD-10-CM | POA: Diagnosis not present

## 2023-06-28 DIAGNOSIS — S39012A Strain of muscle, fascia and tendon of lower back, initial encounter: Secondary | ICD-10-CM

## 2023-06-28 DIAGNOSIS — S161XXA Strain of muscle, fascia and tendon at neck level, initial encounter: Secondary | ICD-10-CM

## 2023-06-28 DIAGNOSIS — G939 Disorder of brain, unspecified: Secondary | ICD-10-CM
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
## 2023-07-09 ENCOUNTER — Encounter: Payer: Self-pay | Admitting: Physical Therapy

## 2023-07-09 ENCOUNTER — Ambulatory Visit: Payer: Medicaid Other | Attending: Family Medicine | Admitting: Physical Therapy

## 2023-07-09 DIAGNOSIS — M542 Cervicalgia: Secondary | ICD-10-CM | POA: Diagnosis present

## 2023-07-09 DIAGNOSIS — S29019A Strain of muscle and tendon of unspecified wall of thorax, initial encounter: Secondary | ICD-10-CM | POA: Insufficient documentation

## 2023-07-09 DIAGNOSIS — S161XXA Strain of muscle, fascia and tendon at neck level, initial encounter: Secondary | ICD-10-CM | POA: Diagnosis not present

## 2023-07-09 DIAGNOSIS — S39012A Strain of muscle, fascia and tendon of lower back, initial encounter: Secondary | ICD-10-CM | POA: Diagnosis not present

## 2023-07-09 DIAGNOSIS — M546 Pain in thoracic spine: Secondary | ICD-10-CM | POA: Diagnosis present

## 2023-07-09 DIAGNOSIS — M5459 Other low back pain: Secondary | ICD-10-CM | POA: Diagnosis present
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
## 2023-07-14 ENCOUNTER — Ambulatory Visit: Payer: Medicaid Other | Admitting: Physical Therapy

## 2023-07-14 ENCOUNTER — Encounter: Payer: Self-pay | Admitting: Physical Therapy

## 2023-07-14 DIAGNOSIS — M542 Cervicalgia: Secondary | ICD-10-CM | POA: Diagnosis not present

## 2023-07-14 DIAGNOSIS — M546 Pain in thoracic spine: Secondary | ICD-10-CM

## 2023-07-14 DIAGNOSIS — M5459 Other low back pain: Secondary | ICD-10-CM
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
## 2023-07-21 ENCOUNTER — Ambulatory Visit: Payer: Medicaid Other | Admitting: Physical Therapy

## 2023-07-28 ENCOUNTER — Ambulatory Visit: Payer: Medicaid Other | Admitting: Physical Therapy

## 2023-07-28 ENCOUNTER — Encounter: Payer: Self-pay | Admitting: Physical Therapy

## 2023-07-28 DIAGNOSIS — M5459 Other low back pain: Secondary | ICD-10-CM

## 2023-07-28 DIAGNOSIS — M546 Pain in thoracic spine: Secondary | ICD-10-CM

## 2023-07-28 DIAGNOSIS — M542 Cervicalgia: Secondary | ICD-10-CM
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
## 2023-08-04 ENCOUNTER — Ambulatory Visit: Payer: Medicaid Other | Admitting: Physical Therapy

## 2023-08-12 ENCOUNTER — Ambulatory Visit: Payer: Medicaid Other | Attending: Family Medicine | Admitting: Physical Therapy

## 2023-08-12 ENCOUNTER — Encounter: Payer: Self-pay | Admitting: Physical Therapy

## 2023-08-12 DIAGNOSIS — M546 Pain in thoracic spine: Secondary | ICD-10-CM | POA: Insufficient documentation

## 2023-08-12 DIAGNOSIS — M5459 Other low back pain: Secondary | ICD-10-CM | POA: Diagnosis present

## 2023-08-12 DIAGNOSIS — M542 Cervicalgia: Secondary | ICD-10-CM | POA: Diagnosis present
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
## 2023-09-03 ENCOUNTER — Ambulatory Visit: Payer: Medicaid Other | Admitting: Family Medicine

## 2023-09-03 ENCOUNTER — Telehealth: Payer: Self-pay | Admitting: Family Medicine

## 2023-09-03 NOTE — Telephone Encounter (Signed)
Did not count as no show, pt replied TEXT NO to cancel visit

## 2023-09-13 ENCOUNTER — Encounter: Payer: Self-pay | Admitting: Physical Therapy

## 2023-09-13 ENCOUNTER — Ambulatory Visit: Payer: Medicaid Other | Admitting: Family Medicine

## 2023-09-13 ENCOUNTER — Ambulatory Visit: Payer: Medicaid Other | Attending: Family Medicine | Admitting: Physical Therapy

## 2023-09-13 DIAGNOSIS — M546 Pain in thoracic spine: Secondary | ICD-10-CM

## 2023-09-13 DIAGNOSIS — M542 Cervicalgia: Secondary | ICD-10-CM

## 2023-09-13 DIAGNOSIS — M5459 Other low back pain: Secondary | ICD-10-CM
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
## 2023-09-20 ENCOUNTER — Ambulatory Visit (INDEPENDENT_AMBULATORY_CARE_PROVIDER_SITE_OTHER): Payer: Medicaid Other | Admitting: Internal Medicine

## 2023-09-20 ENCOUNTER — Encounter: Payer: Self-pay | Admitting: Internal Medicine

## 2023-09-20 VITALS — BP 120/86 | HR 83 | Temp 97.7°F | Ht 66.0 in | Wt 272.8 lb

## 2023-09-20 DIAGNOSIS — M545 Low back pain, unspecified: Secondary | ICD-10-CM
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
## 2023-09-20 NOTE — Patient Instructions (Signed)
[facility] [address]  [phone] Open ? Closes 7?PM

## 2023-09-28 ENCOUNTER — Ambulatory Visit (INDEPENDENT_AMBULATORY_CARE_PROVIDER_SITE_OTHER): Payer: Medicaid Other | Admitting: Orthopaedic Surgery

## 2023-09-28 VITALS — BP 134/93 | HR 96

## 2023-09-28 DIAGNOSIS — S39012D Strain of muscle, fascia and tendon of lower back, subsequent encounter: Secondary | ICD-10-CM

## 2023-09-28 DIAGNOSIS — M545 Low back pain, unspecified: Secondary | ICD-10-CM

## 2023-09-28 NOTE — Progress Notes (Unsigned)
   Office Visit Note   Patient: Brandy Garza           Date of Birth: 1991-08-09           MRN: [id number] Visit Date: 09/28/2023              Requested by: Salvatore Decent, FNP [address] [place],  [place] [address] PCP: Mliss Sax, MD   Assessment & Plan: Visit Diagnoses: No diagnosis found.  Plan: ***  Follow-Up Instructions: No follow-ups on file.   Orders:  No orders of the defined types were placed in this encounter.  No orders of the defined types were placed in this encounter.     Procedures: No procedures performed   Clinical Data: No additional findings.   Subjective: Chief Complaint  Patient presents with   Lower Back - Pain    HPI  Review of Systems   Objective: Vital Signs: BP (!) 134/93 (BP Location: Right Arm, Patient Position: Sitting)   Pulse 96   Physical Exam  Ortho Exam  Specialty Comments:  No specialty comments available.  Imaging: No results found.   PMFS History: Patient Active Problem List   Diagnosis Date Noted   Cervical strain 06/14/2023   Thoracic myofascial strain 06/14/2023   Strain of lumbar region 06/14/2023   Lesion of temporal lobe 06/14/2023   Hospital discharge follow-up 06/07/2023   Elevated LDL cholesterol level 06/07/2023   Abnormal magnetic resonance cholangiopancreatography (MRCP) 05/28/2023   Elevated LFTs 05/28/2023   RUQ abdominal pain 05/28/2023   Biliary obstruction 05/26/2023   Morbid obesity with BMI of 40.0-44.9, adult (HCC) 01/01/2023   Type 2 diabetes mellitus with hyperglycemia, without long-term current use of insulin (HCC) 01/01/2023   Need for Tdap vaccination 01/01/2023   Past Medical History:  Diagnosis Date   Anemia    Bronchitis    Diabetes mellitus without complication (HCC)     No family history on file.  Past Surgical History:  Procedure Laterality Date   CESAREAN SECTION     CHOLECYSTECTOMY N/A 05/30/2023   Procedure: LAPAROSCOPIC CHOLECYSTECTOMY;   Surgeon: Fritzi Mandes, MD;  Location: [facility] OR;  Service: General;  Laterality: N/A;   ERCP N/A 05/28/2023   Procedure: ENDOSCOPIC RETROGRADE CHOLANGIOPANCREATOGRAPHY (ERCP);  Surgeon: Meryl Dare, MD;  Location:  [facility] ENDOSCOPY;  Service: Gastroenterology;  Laterality: N/A;   REMOVAL OF STONES  05/28/2023   Procedure: REMOVAL OF STONES;  Surgeon: Meryl Dare, MD;  Location: [facility] ENDOSCOPY;  Service: Gastroenterology;;   Dennison Mascot  05/28/2023   Procedure: SPHINCTEROTOMY;  Surgeon: Meryl Dare, MD;  Location: [facility] ENDOSCOPY;  Service: Gastroenterology;;   WISDOM TOOTH EXTRACTION Right    bottom   Social History   Occupational History   Not on file  Tobacco Use   Smoking status: Former   Smokeless tobacco: Never  Vaping Use   Vaping status: Never Used  Substance and Sexual Activity   Alcohol use: Yes    Comment: rarely   Drug use: No   Sexual activity: Not on file

## 2023-09-30 ENCOUNTER — Encounter: Payer: Self-pay | Admitting: Orthopaedic Surgery

## 2023-10-03 ENCOUNTER — Ambulatory Visit
Admission: RE | Admit: 2023-10-03 | Discharge: 2023-10-03 | Discharge status: Home / Self Care | Payer: Medicaid Other | Source: Ambulatory Visit | Attending: Orthopaedic Surgery | Admitting: Orthopaedic Surgery

## 2023-10-03 DIAGNOSIS — M545 Low back pain, unspecified: Secondary | ICD-10-CM

## 2023-10-05 ENCOUNTER — Other Ambulatory Visit: Payer: Medicaid Other

## 2023-10-13 ENCOUNTER — Other Ambulatory Visit: Payer: Self-pay

## 2023-10-13 DIAGNOSIS — E1165 Type 2 diabetes mellitus with hyperglycemia: Secondary | ICD-10-CM

## 2023-10-13 MED ORDER — METFORMIN HCL 500 MG PO TABS: 500.0000 mg | ORAL_TABLET | Freq: Two times a day (BID) | ORAL | 0 refills | Status: DC

## 2023-10-13 MED ORDER — LANTUS SOLOSTAR 100 UNIT/ML ~~LOC~~ SOPN: PEN_INJECTOR | SUBCUTANEOUS | 0 refills | Status: DC

## 2023-10-13 NOTE — Telephone Encounter (Signed)
 Refill request for  Metformin  500 mg  LR 03/08/23, #180, 0 rf  Lantus  Solostar pen LR 10/07/22, #15 ml, 1 rf LOV  06/28/23 FOV  10/25/23  Please review and advise.  Thanks. Dm/cma

## 2023-10-25 ENCOUNTER — Telehealth: Payer: Self-pay | Admitting: Family Medicine

## 2023-10-25 ENCOUNTER — Ambulatory Visit: Payer: Medicaid Other | Admitting: Family Medicine

## 2023-11-02 ENCOUNTER — Ambulatory Visit: Payer: Medicaid Other | Admitting: Orthopaedic Surgery

## 2023-11-02 NOTE — Telephone Encounter (Signed)
 04/13/2023 no show 10/25/2023 no show  Final warning sent via mail and mychart.

## 2023-11-06 ENCOUNTER — Encounter: Payer: Self-pay | Admitting: Emergency Medicine

## 2023-11-06 ENCOUNTER — Other Ambulatory Visit: Payer: Self-pay

## 2023-11-06 ENCOUNTER — Ambulatory Visit
Admission: EM | Admit: 2023-11-06 | Discharge: 2023-11-06 | Disposition: A | Discharge status: Home / Self Care | Payer: Self-pay | Attending: Internal Medicine | Admitting: Internal Medicine

## 2023-11-06 DIAGNOSIS — J020 Streptococcal pharyngitis: Secondary | ICD-10-CM

## 2023-11-06 LAB — POC COVID19/FLU A&B COMBO
Covid Antigen, POC: NEGATIVE
Influenza A Antigen, POC: NEGATIVE
Influenza B Antigen, POC: NEGATIVE

## 2023-11-06 LAB — POCT RAPID STREP A (OFFICE): Rapid Strep A Screen: POSITIVE — AB

## 2023-11-06 MED ORDER — AMOXICILLIN 500 MG PO CAPS
500.0000 mg | ORAL_CAPSULE | Freq: Two times a day (BID) | ORAL | 0 refills | Status: AC
Start: 2023-11-06 — End: 2023-11-16

## 2023-11-06 NOTE — Discharge Instructions (Addendum)
 You have strep throat.  - Take the prescribed antibiotic as directed for the next 10 days. - Take ibuprofen 600 mg every 6 hours as needed for throat pain and fever.  - Change your toothbrush after 2 to 3 days of antibiotic use to prevent reinfection. - You may also gargle with salt water and drink warm tea with honey to soothe your throat.  If you develop any new or worsening symptoms or if your symptoms do not start to improve, please return here or follow-up with your primary care provider. If your symptoms are severe, please go to the emergency room.

## 2023-11-06 NOTE — ED Triage Notes (Signed)
 Pt c/o cold like symptoms for the past 3 days, sore throat, fever, chills, vomiting and body aches.

## 2023-11-06 NOTE — ED Provider Notes (Signed)
 [facility]    CSN: [id number] Arrival date & time: 11/06/23  1055      History   Chief Complaint Chief Complaint  Patient presents with   URI    HPI Brandy Garza is a 33 y.o. female.   Patient presents to urgent care for evaluation of sore throat, body aches, nausea, vomiting, and fever/chills that started 3 days ago. Sore throat is currently a 7/10 with swallowing. Denies vocal changes, difficulty maintaining secretions. Max temp at home 100-101 yesterday. Reports throat clearing cough, no deep cough. Denies shortness of breath, rash, diarrhea, and dizziness. She had one episode of nausea and vomiting yesterday. Denies recent sick contacts with similar symptoms.   The history is provided by the patient. No language interpreter was used.  URI   Past Medical History:  Diagnosis Date   Anemia    Bronchitis    Diabetes mellitus without complication (HCC)     Patient Active Problem List   Diagnosis Date Noted   Cervical strain 06/14/2023   Thoracic myofascial strain 06/14/2023   Strain of lumbar region 06/14/2023   Lesion of temporal lobe 06/14/2023   Hospital discharge follow-up 06/07/2023   Elevated LDL cholesterol level 06/07/2023   Abnormal magnetic resonance cholangiopancreatography (MRCP) 05/28/2023   Elevated LFTs 05/28/2023   RUQ abdominal pain 05/28/2023   Biliary obstruction 05/26/2023   Morbid obesity with BMI of 40.0-44.9, adult (HCC) 01/01/2023   Type 2 diabetes mellitus with hyperglycemia, without long-term current use of insulin (HCC) 01/01/2023   Need for Tdap vaccination 01/01/2023    Past Surgical History:  Procedure Laterality Date   CESAREAN SECTION     CHOLECYSTECTOMY N/A 05/30/2023   Procedure: LAPAROSCOPIC CHOLECYSTECTOMY;  Surgeon: Fritzi Mandes, MD;  Location: [facility] OR;  Service: General;  Laterality: N/A;   ERCP N/A 05/28/2023   Procedure: ENDOSCOPIC RETROGRADE CHOLANGIOPANCREATOGRAPHY (ERCP);  Surgeon: Meryl Dare, MD;   Location: [facility] ENDOSCOPY;  Service: Gastroenterology;  Laterality: N/A;   REMOVAL OF STONES  05/28/2023   Procedure: REMOVAL OF STONES;  Surgeon: Meryl Dare, MD;  Location: [facility] ENDOSCOPY;  Service: Gastroenterology;;   Dennison Mascot  05/28/2023   Procedure: SPHINCTEROTOMY;  Surgeon: Meryl Dare, MD;  Location: [facility] ENDOSCOPY;  Service: Gastroenterology;;   WISDOM TOOTH EXTRACTION Right    bottom    OB History   No obstetric history on file.      Home Medications    Prior to Admission medications   Medication Sig Start Date End Date Taking? Authorizing Provider  amoxicillin (AMOXIL) 500 MG capsule Take 1 capsule (500 mg total) by mouth 2 (two) times daily for 10 days. 11/06/23 11/16/23 Yes Aleli Navedo, Donavan Burnet, FNP  ibuprofen (ADVIL,MOTRIN) 200 MG tablet Take 600 mg by mouth once as needed for headache or moderate pain.    [provider]  insulin glargine (LANTUS SOLOSTAR) 100 UNIT/ML Solostar Pen Start with 20 U s.q. nightly. 10/13/23   McElwee, Jake Church, NP  levonorgestrel (MIRENA) 20 MCG/24HR IUD 1 each by Intrauterine route once. Inserted 2011    [provider]  meloxicam (MOBIC) 7.5 MG tablet Take 1 tablet (7.5 mg total) by mouth daily. Patient not taking: Reported on 09/20/2023 06/16/23   Mliss Sax, MD  metFORMIN (GLUCOPHAGE) 500 MG tablet Take 1 tablet (500 mg total) by mouth 2 (two) times daily. 10/13/23   McElwee, Lauren A, NP  methocarbamol (ROBAXIN) 500 MG tablet Take 1 tablet (500 mg total) by mouth every 8 (eight) hours as  needed for muscle spasms. Patient not taking: Reported on 09/20/2023 06/15/23   Mliss Sax, MD    Family History History reviewed. No pertinent family history.  Social History Social History   Tobacco Use   Smoking status: Former   Smokeless tobacco: Never  Advertising account planner   Vaping status: Never Used  Substance Use Topics   Alcohol use: Yes    Comment: rarely   Drug use: No     Allergies   Latex and  Flagyl [metronidazole hcl]   Review of Systems Review of Systems Per HPI  Physical Exam Triage Vital Signs ED Triage Vitals [11/06/23 1112]  Encounter Vitals Group     BP (!) 139/95     Systolic BP Percentile      Diastolic BP Percentile      Pulse Rate 88     Resp 18     Temp 98.3 F (36.8 C)     Temp Source Oral     SpO2 98 %     Weight      Height      Head Circumference      Peak Flow      Pain Score 5     Pain Loc      Pain Education      Exclude from Growth Chart    No data found.  Updated Vital Signs BP (!) 139/95 (BP Location: Right Arm)   Pulse 88   Temp 98.3 F (36.8 C) (Oral)   Resp 18   SpO2 98%   Visual Acuity Right Eye Distance:   Left Eye Distance:   Bilateral Distance:    Right Eye Near:   Left Eye Near:    Bilateral Near:     Physical Exam Vitals and nursing note reviewed.  Constitutional:      Appearance: She is not ill-appearing or toxic-appearing.  HENT:     Head: Normocephalic and atraumatic.     Right Ear: Hearing, tympanic membrane, ear canal and external ear normal.     Left Ear: Hearing, tympanic membrane, ear canal and external ear normal.     Nose: Nose normal.     Mouth/Throat:     Lips: Pink.     Mouth: Mucous membranes are moist. No injury or oral lesions.     Dentition: Normal dentition.     Tongue: No lesions.     Pharynx: Uvula midline. Pharyngeal swelling and posterior oropharyngeal erythema present. No oropharyngeal exudate, uvula swelling or postnasal drip.     Tonsils: Tonsillar exudate present. No tonsillar abscesses. 2+ on the right. 2+ on the left.     Comments: No trismus, phonation normal, maintaining secretions without difficulty.  Eyes:     General: Lids are normal. Vision grossly intact. Gaze aligned appropriately.     Extraocular Movements: Extraocular movements intact.     Conjunctiva/sclera: Conjunctivae normal.  Neck:     Trachea: Trachea and phonation normal.  Cardiovascular:     Rate and Rhythm:  Normal rate and regular rhythm.     Heart sounds: Normal heart sounds, S1 normal and S2 normal.  Pulmonary:     Effort: Pulmonary effort is normal. No respiratory distress.     Breath sounds: Normal breath sounds and air entry. No wheezing, rhonchi or rales.  Chest:     Chest wall: No tenderness.  Musculoskeletal:     Cervical back: Neck supple.  Lymphadenopathy:     Cervical: Cervical adenopathy present.  Skin:    General:  Skin is warm and dry.     Capillary Refill: Capillary refill takes less than 2 seconds.     Findings: No rash.  Neurological:     General: No focal deficit present.     Mental Status: She is alert and oriented to person, place, and time. Mental status is at baseline.     Cranial Nerves: No dysarthria or facial asymmetry.  Psychiatric:        Mood and Affect: Mood normal.        Speech: Speech normal.        Behavior: Behavior normal.        Thought Content: Thought content normal.        Judgment: Judgment normal.      UC Treatments / Results  Labs (all labs ordered are listed, but only abnormal results are displayed) Labs Reviewed  POCT RAPID STREP A (OFFICE) - Abnormal; Notable for the following components:      Result Value   Rapid Strep A Screen Positive (*)    All other components within normal limits  POC COVID19/FLU A&B COMBO - Normal    EKG   Radiology No results found.  Procedures Procedures (including critical care time)  Medications Ordered in UC Medications - No data to display  Initial Impression / Assessment and Plan / UC Course  I have reviewed the triage vital signs and the nursing notes.  Pertinent labs & imaging results that were available during my care of the patient were reviewed by me and considered in my medical decision making (see chart for details).   1. Streptococcal sore throat POC strep testing is positive.   Amoxicillin sent to pharmacy to be taken as prescribed.  OTC medications like tylenol/ibuprofen as  needed for throat pain and fever/chills.   Advised to change toothbrush after 2 to 3 days of antibiotic use to prevent reinfection.   Salt water gargles and warm tea with honey may be used as needed to soothe sore throat.   Counseled patient on potential for adverse effects with medications prescribed/recommended today, strict ER and return-to-clinic precautions discussed, patient verbalized understanding.    Final Clinical Impressions(s) / UC Diagnoses   Final diagnoses:  Streptococcal sore throat     Discharge Instructions      You have strep throat.  - Take the prescribed antibiotic as directed for the next 10 days. - Take ibuprofen 600 mg every 6 hours as needed for throat pain and fever.  - Change your toothbrush after 2 to 3 days of antibiotic use to prevent reinfection. - You may also gargle with salt water and drink warm tea with honey to soothe your throat.   If you develop any new or worsening symptoms or if your symptoms do not start to improve, please return here or follow-up with your primary care provider. If your symptoms are severe, please go to the emergency room.     ED Prescriptions     Medication Sig Dispense Auth. Provider   amoxicillin (AMOXIL) 500 MG capsule Take 1 capsule (500 mg total) by mouth 2 (two) times daily for 10 days. 20 capsule Carlisle Beers, FNP      PDMP not reviewed this encounter.   Carlisle Beers, [place] 11/06/23 1900

## 2023-11-11 ENCOUNTER — Ambulatory Visit: Admitting: Family Medicine

## 2023-11-12 ENCOUNTER — Encounter: Payer: Self-pay | Admitting: Orthopaedic Surgery

## 2023-11-12 ENCOUNTER — Ambulatory Visit: Payer: Self-pay | Admitting: Orthopaedic Surgery

## 2023-11-12 VITALS — BP 122/73 | HR 97

## 2023-11-12 DIAGNOSIS — S39012D Strain of muscle, fascia and tendon of lower back, subsequent encounter: Secondary | ICD-10-CM
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
## 2023-11-15 ENCOUNTER — Encounter: Payer: Self-pay | Admitting: Family Medicine

## 2023-11-15 ENCOUNTER — Ambulatory Visit (INDEPENDENT_AMBULATORY_CARE_PROVIDER_SITE_OTHER): Admitting: Family Medicine

## 2023-11-15 VITALS — BP 117/79 | Temp 97.9°F | Resp 18 | Wt 267.6 lb

## 2023-11-15 DIAGNOSIS — E1165 Type 2 diabetes mellitus with hyperglycemia: Secondary | ICD-10-CM

## 2023-11-15 DIAGNOSIS — Z6841 Body Mass Index (BMI) 40.0 and over, adult: Secondary | ICD-10-CM

## 2023-11-15 DIAGNOSIS — Z794 Long term (current) use of insulin: Secondary | ICD-10-CM

## 2023-11-15 DIAGNOSIS — E7800 Pure hypercholesterolemia, unspecified: Secondary | ICD-10-CM | POA: Diagnosis not present

## 2023-11-15 DIAGNOSIS — Z7985 Long-term (current) use of injectable non-insulin antidiabetic drugs: Secondary | ICD-10-CM

## 2023-11-15 LAB — BASIC METABOLIC PANEL
BUN: 12 mg/dL (ref 6–23)
CO2: 27 meq/L (ref 19–32)
Calcium: 9.2 mg/dL (ref 8.4–10.5)
Chloride: 103 meq/L (ref 96–112)
Creatinine, Ser: 0.66 mg/dL (ref 0.40–1.20)
GFR: 114.38 mL/min (ref >60.00)
Glucose, Bld: 184 mg/dL — ABNORMAL HIGH (ref 70–99)
Potassium: 3.7 meq/L (ref 3.5–5.1)
Sodium: 138 meq/L (ref 135–145)

## 2023-11-15 LAB — LIPID PANEL
Cholesterol: 179 mg/dL (ref 0–200)
HDL: 44 mg/dL (ref >39.00)
LDL Cholesterol: 120 mg/dL — ABNORMAL HIGH (ref 0–99)
NonHDL: 135.35
Total CHOL/HDL Ratio: 4
Triglycerides: 79 mg/dL (ref 0.0–149.0)
VLDL: 15.8 mg/dL (ref 0.0–40.0)

## 2023-11-15 LAB — MICROALBUMIN / CREATININE URINE RATIO
Creatinine,U: 190.7 mg/dL
Microalb Creat Ratio: 4.9 mg/g (ref 0.0–30.0)
Microalb, Ur: 0.9 mg/dL (ref 0.0–1.9)

## 2023-11-15 LAB — HEMOGLOBIN A1C: Hgb A1c MFr Bld: 8.9 % — ABNORMAL HIGH (ref 4.6–6.5)

## 2023-11-15 MED ORDER — TIRZEPATIDE 2.5 MG/0.5ML ~~LOC~~ SOAJ: 2.5000 mg | SUBCUTANEOUS | 3 refills | Status: DC

## 2023-11-15 MED ORDER — LANTUS SOLOSTAR 100 UNIT/ML ~~LOC~~ SOPN: PEN_INJECTOR | SUBCUTANEOUS | 0 refills | Status: DC

## 2023-11-15 NOTE — Progress Notes (Addendum)
 Established Patient Office Visit   Subjective:  Patient ID: Brandy Garza, female    DOB: 03/13/1991  Age: 33 y.o. MRN: [id number]  Chief Complaint  Patient presents with   Pain Management    1 month follow up neck and back pain, and DM. Pt states pain has improved, she is currently taking OTC Dual Action and is seeing Orthopedics ; eye exam is due     HPI Encounter Diagnoses  Name Primary?   Type 2 diabetes mellitus with hyperglycemia, without long-term current use of insulin (HCC) Yes   Morbid obesity with BMI of 40.0-44.9, adult (HCC)    Elevated LDL cholesterol level    For follow-up of above.  Continues with Lantus 20 units nightly.  Fasting sugars typically run in the less than 130 range.  No regular exercise program but she is active with her toddler.  They go outside and play daily.  She follows with American diabetic diet of no more than 60 carbs per meal.   Review of Systems  Constitutional: Negative.   HENT: Negative.    Eyes:  Negative for blurred vision, discharge and redness.  Respiratory: Negative.    Cardiovascular: Negative.   Gastrointestinal:  Negative for abdominal pain.  Genitourinary: Negative.   Musculoskeletal: Negative.  Negative for myalgias.  Skin:  Negative for rash.  Neurological:  Negative for tingling, loss of consciousness and weakness.  Endo/Heme/Allergies:  Negative for polydipsia.     Current Outpatient Medications:    ibuprofen (ADVIL,MOTRIN) 200 MG tablet, Take 600 mg by mouth once as needed for headache or moderate pain., Disp: , Rfl:    levonorgestrel (MIRENA) 20 MCG/24HR IUD, 1 each by Intrauterine route once. Inserted 2011, Disp: , Rfl:    meloxicam (MOBIC) 7.5 MG tablet, Take 1 tablet (7.5 mg total) by mouth daily., Disp: 30 tablet, Rfl: 0   metFORMIN (GLUCOPHAGE) 500 MG tablet, Take 1 tablet (500 mg total) by mouth 2 (two) times daily., Disp: 180 tablet, Rfl: 0   methocarbamol (ROBAXIN) 500 MG tablet, Take 1 tablet (500 mg total)  by mouth every 8 (eight) hours as needed for muscle spasms., Disp: 30 tablet, Rfl: 0   Semaglutide,0.25 or 0.5MG /DOS, (OZEMPIC, 0.25 OR 0.5 MG/DOSE,) 2 MG/3ML SOPN, Inject 0.25 mg into the skin once a week for 28 days, THEN 0.5 mg once a week., Disp: 3 mL, Rfl: 1   insulin glargine (LANTUS SOLOSTAR) 100 UNIT/ML Solostar Pen, Start with 20 U s.q. nightly. (Patient taking differently: Inject 22 Units into the skin at bedtime.), Disp: 15 mL, Rfl: 0   Objective:     BP 117/79 (BP Location: Left Arm, Patient Position: Sitting, Cuff Size: Large)   Temp 97.9 F (36.6 C) (Temporal)   Resp 18   Wt 267 lb 9.6 oz (121.4 kg)   SpO2 98%   BMI 43.19 kg/m    Physical Exam Constitutional:      General: She is not in acute distress.    Appearance: Normal appearance. She is not ill-appearing, toxic-appearing or diaphoretic.  HENT:     Head: Normocephalic and atraumatic.     Right Ear: External ear normal.     Left Ear: External ear normal.     Mouth/Throat:     Mouth: Mucous membranes are moist.     Pharynx: Oropharynx is clear. No oropharyngeal exudate or posterior oropharyngeal erythema.  Eyes:     General: No scleral icterus.       Right eye: No discharge.  Left eye: No discharge.     Extraocular Movements: Extraocular movements intact.     Conjunctiva/sclera: Conjunctivae normal.     Pupils: Pupils are equal, round, and reactive to light.  Cardiovascular:     Rate and Rhythm: Normal rate and regular rhythm.  Pulmonary:     Effort: Pulmonary effort is normal. No respiratory distress.     Breath sounds: Normal breath sounds.  Musculoskeletal:     Cervical back: No rigidity or tenderness.  Skin:    General: Skin is warm and dry.  Neurological:     Mental Status: She is alert and oriented to person, place, and time.  Psychiatric:        Mood and Affect: Mood normal.        Behavior: Behavior normal.      Results for orders placed or performed in visit on 11/15/23  Basic  metabolic panel  Result Value Ref Range   Sodium 138 135 - 145 mEq/L   Potassium 3.7 3.5 - 5.1 mEq/L   Chloride 103 96 - 112 mEq/L   CO2 27 19 - 32 mEq/L   Glucose, Bld 184 (H) 70 - 99 mg/dL   BUN 12 6 - 23 mg/dL   Creatinine, Ser 8.11 0.40 - 1.20 mg/dL   GFR 914.78 >29.56 mL/min   Calcium 9.2 8.4 - 10.5 mg/dL  Hemoglobin O1H  Result Value Ref Range   Hgb A1c MFr Bld 8.9 (H) 4.6 - 6.5 %  Microalbumin / creatinine urine ratio  Result Value Ref Range   Microalb, Ur 0.9 0.0 - 1.9 mg/dL   Creatinine,U 086.5 mg/dL   Microalb Creat Ratio 4.9 0.0 - 30.0 mg/g  Lipid panel  Result Value Ref Range   Cholesterol 179 0 - 200 mg/dL   Triglycerides 78.4 0.0 - 149.0 mg/dL   HDL 69.62 >95.28 mg/dL   VLDL 41.3 0.0 - 24.4 mg/dL   LDL Cholesterol 010 (H) 0 - 99 mg/dL   Total CHOL/HDL Ratio 4    NonHDL 135.35       The ASCVD Risk score (Arnett DK, et al., 2019) failed to calculate for the following reasons:   The 2019 ASCVD risk score is only valid for ages 32 to 2    Assessment & Plan:   Type 2 diabetes mellitus with hyperglycemia, without long-term current use of insulin (HCC) -     Basic metabolic panel -     Hemoglobin A1c -     Microalbumin / creatinine urine ratio -     Amb ref to Medical Nutrition Therapy-MNT -     Ambulatory referral to Ophthalmology -     Lantus SoloStar; Start with 20 U s.q. nightly. (Patient taking differently: Inject 22 Units into the skin at bedtime.)  Dispense: 15 mL; Refill: 0 -     AMB Referral VBCI Care Management -     Ozempic (0.25 or 0.5 MG/DOSE); Inject 0.25 mg into the skin once a week for 28 days, THEN 0.5 mg once a week.  Dispense: 3 mL; Refill: 1  Morbid obesity with BMI of 40.0-44.9, adult (HCC) -     Amb ref to Medical Nutrition Therapy-MNT -     Ozempic (0.25 or 0.5 MG/DOSE); Inject 0.25 mg into the skin once a week for 28 days, THEN 0.5 mg once a week.  Dispense: 3 mL; Refill: 1  Elevated LDL cholesterol level -     Lipid  panel    Return in about 3 months (around  02/15/2024).  Recommend that she have at least 30 minutes of exercise such as walking daily.  Have referred her for nutrition counseling.  Discussed GLP-1 agonist.  She would like to try it.  Discussed nausea and constipation or loose stools being the common side effects.  Discussed the warnings of significant abdominal pain possibly indicating pancreatitis, intestinal obstruction.  Discussed the theoretical risk of thyroid C cancer.  Information given on tirzepatide.   continue metformin and Lantus.  Mliss Sax, MD

## 2023-11-16 ENCOUNTER — Ambulatory Visit: Admitting: Dietician

## 2023-11-16 NOTE — Addendum Note (Signed)
 Addended by: Andrez Grime on: 11/16/2023 12:27 PM   Modules accepted: Orders

## 2023-11-17 ENCOUNTER — Telehealth: Payer: Self-pay

## 2023-11-17 ENCOUNTER — Other Ambulatory Visit (HOSPITAL_COMMUNITY): Payer: Self-pay

## 2023-11-17 NOTE — Telephone Encounter (Signed)
 Pharmacy Patient Advocate Encounter  Received notification from [facility] Medicaid that Prior Authorization for [facility] 2.5MG /0.5ML auto-injectors has been DENIED.  See denial reason below. No denial letter attached in CMM. Will attach denial letter to Media tab once received.   PA #/Case ID/Reference #: [id number]

## 2023-11-17 NOTE — Telephone Encounter (Signed)
 Pharmacy Patient Advocate Encounter   Received notification from Onbase that prior authorization for [facility] 2.5MG /0.5ML auto-injectors is required/requested.   Insurance verification completed.   The patient is insured through [facility] [place] .   Per test claim: PA required; PA submitted to above mentioned insurance via CoverMyMeds Key/confirmation #/EOC [id number] Status is pending

## 2023-11-18 ENCOUNTER — Telehealth: Payer: Self-pay

## 2023-11-18 NOTE — Progress Notes (Signed)
 Care Guide Pharmacy Note  11/18/2023 Name: Brandy Garza MRN: [id number] DOB: [birth year]  Referred By: Mliss Sax, MD Reason for referral: Care Coordination (Outreach to schedule with Pharm d )   Brandy Garza is a 33 y.o. year old female who is a primary care patient of Mliss Sax, MD.  Brandy Garza was referred to the pharmacist for assistance related to: DMII  Successful contact was made with the patient to discuss pharmacy services including being ready for the pharmacist to call at least 5 minutes before the scheduled appointment time and to have medication bottles and any blood pressure readings ready for review. The patient agreed to meet with the pharmacist via telephone visit on (date/time).11/29/2023  Penne Lash , RMA     [place]  [facility], [facility] Guide  Direct Dial: [phone]  Website: [email or web address]

## 2023-11-29 ENCOUNTER — Other Ambulatory Visit: Payer: Self-pay

## 2023-11-29 NOTE — Progress Notes (Signed)
 11/29/2023 Name: Brandy Garza MRN: [id number] DOB: 1990/12/24  Chief Complaint  Patient presents with   Diabetes Management Plan   Brandy Garza is a 33 y.o. year old female who presented for a telephone visit.   They were referred to the pharmacist by their PCP for assistance in managing diabetes.   Subjective:  Care Team: Primary Care Provider: Mliss Sax, MD ; Next Scheduled Visit: 02/15/24  Medication Access/Adherence  Current Pharmacy:  [id number] [place], [place] - [address] AT [facility] & [address] [place] [address] Phone: [phone] Fax: [phone]  -Patient reports affordability concerns with their medications: Yes  -Patient reports access/transportation concerns to their pharmacy: No  -Patient reports adherence concerns with their medications:  Yes     Diabetes: Current medications: Lantus 22 units at bedtime, metformin 500 mg twice daily, Mounjaro 2.5 mg weekly -Patient was recently seen by PCP 3/10, and A1c was elevated at 8.9% -Patient does monitor home blood glucose but not on a regular basis.  Would benefit from CGM and expresses willingness and ability to properly use freestyle libre 3+ and monitor blood glucose appropriately. -Patient is currently using Lantus and is increased to 22 units at bedtime; she is also using metformin 500 mg twice daily and has been on this medication for at least 1 year now -Mounjaro 2.5 mg weekly was recently prescribed based on elevated A1c, but insurance has denied covering this medication -Patient has made and is maintaining lifestyle modifications, including 30 minutes of exercise daily and dietary changes to assist with lowering A1c (decreased sugar and carbohydrate intake)  Objective:  Lab Results  Component Value Date   HGBA1C 8.9 (H) 11/15/2023   Lab Results  Component Value Date   CREATININE 0.66 11/15/2023   BUN 12 11/15/2023    NA 138 11/15/2023   K 3.7 11/15/2023   CL 103 11/15/2023   CO2 27 11/15/2023   Lab Results  Component Value Date   CHOL 179 11/15/2023   HDL 44.00 11/15/2023   LDLCALC 120 (H) 11/15/2023   TRIG 79.0 11/15/2023   CHOLHDL 4 11/15/2023   Medications Reviewed Today     Reviewed by Lenna Gilford, RPH (Pharmacist) on 11/29/23 at 1353  Med List Status: <None>   Medication Order Taking? Sig Documenting Provider Last Dose Status Informant  ibuprofen (ADVIL,MOTRIN) 200 MG tablet [id number] Yes Take 600 mg by mouth once as needed for headache or moderate pain. [provider] Taking Active Self, Pharmacy Records  insulin glargine (LANTUS SOLOSTAR) 100 UNIT/ML Solostar Pen [id number] Yes Start with 20 U s.q. nightly.  Patient taking differently: Inject 22 Units into the skin at bedtime.   Mliss Sax, MD Taking Active   levonorgestrel [facility]) 20 MCG/24HR IUD [id number]  1 each by Intrauterine route once. Inserted 2011 [provider]  Active Self, Pharmacy Records  meloxicam (MOBIC) 7.5 MG tablet [id number]  Take 1 tablet (7.5 mg total) by mouth daily. Mliss Sax, MD  Active   metFORMIN (GLUCOPHAGE) 500 MG tablet [id number] Yes Take 1 tablet (500 mg total) by mouth 2 (two) times daily. McElwee, Lauren A, NP Taking Active   methocarbamol (ROBAXIN) 500 MG tablet [id number]  Take 1 tablet (500 mg total) by mouth every 8 (eight) hours as needed for muscle spasms. Mliss Sax, MD  Active   tirzepatide [facility]) 2.5 MG/0.5ML Pen [id number] No Inject 2.5 mg into the skin once  a week.  Patient not taking: Reported on 11/29/2023   Mliss Sax, MD Not Taking Active            Assessment/Plan:   Diabetes: -Currently uncontrolled -Submitting prior authorization for freestyle libre 3+ sensors via cover my meds -Patient is covered by Medicaid, and their formulary will not cover Mounjaro or any other non-preferred medication in this drug class  until 2 preferred medications have been tried and failed.  Preferred medications include Byetta, Trulicity, Victoza, or Ozempic.  I recommend patient initially try Trulicity or Ozempic; consulting with Dr. Doreene Burke.  Based on his preference, I will pursue a prior authorization for that medication. -Patient sees PCP again 6/10 for follow-up A1c  Follow Up Plan: Will follow-up with patient once medication plan and approvals by insurance are established  Lenna Gilford, PharmD, DPLA

## 2023-11-30 MED ORDER — OZEMPIC (0.25 OR 0.5 MG/DOSE) 2 MG/3ML ~~LOC~~ SOPN: PEN_INJECTOR | SUBCUTANEOUS | 1 refills | Status: DC

## 2023-11-30 NOTE — Addendum Note (Signed)
 Addended by: Andrez Grime on: 11/30/2023 12:54 PM   Modules accepted: Orders

## 2023-12-02 ENCOUNTER — Telehealth: Payer: Self-pay

## 2023-12-02 MED ORDER — FREESTYLE LIBRE 3 PLUS SENSOR MISC: 4 refills | Status: DC

## 2023-12-02 NOTE — Progress Notes (Signed)
   12/02/2023  Patient ID: Brandy Garza, female   DOB: 1991-07-07, 32 y.o.   MRN: [id number]  Patient outreach to inform Brandy Garza Libre 3+ sensors have been approved by insurance.  Prescription sent to [facility] under [facility] standing order for CGM.  Going through at $0 and will be ready today.  Patient plans to use app on phone for reader.  Informed her that Ozempic 0.25/0.5mg  has been prescribed based on insurance requirements.  Prior authorization was required; I submitted via CMM this morning, and it has been approved.  Contacted pharmacy, and medication going through for $4 copay, but they have to order (will be in tomorrow).  Discussed administration and dosing of medication.    Telephone follow-up scheduled in 4 weeks to check tolerance/efficacy of Ozempic.  Sending patient MyChart message with my direct phone number in case she needs anything before follow-up appointment.  I will also get her preferred email address, so I can send invite to share Brandy Garza data with [facility] LibreView Dashboard.  Brandy Garza, PharmD, DPLA

## 2023-12-06 ENCOUNTER — Other Ambulatory Visit (HOSPITAL_COMMUNITY): Payer: Self-pay

## 2024-01-06 ENCOUNTER — Other Ambulatory Visit: Payer: Self-pay

## 2024-01-06 NOTE — Progress Notes (Signed)
 Outreach for scheduled telephone visit.  I was able to reach the patient, but she did request a later call due to taking son into dentist appointment.  Visit moved to 4pm.  Linn Rich, PharmD, DPLA

## 2024-01-06 NOTE — Progress Notes (Unsigned)
   01/06/2024  Patient ID: Rema Care, female   DOB: 07-29-1991, 33 y.o.   MRN: [id number]  Outreach attempt for telephone follow-up visit was unsuccessful, but I was able to leave HIPAA compliant voicemail with my direct phone number.  I am also sending a MyChart message to attempt to reschedule visit.  Linn Rich, PharmD, DPLA

## 2024-01-21 ENCOUNTER — Telehealth: Payer: Self-pay

## 2024-01-21 NOTE — Progress Notes (Signed)
   01/21/2024  Patient ID: Brandy Garza, female   DOB: 1991/07/26, 33 y.o.   MRN: [id number]  Returning missed call/voicemail from patient.  Patient has had 3 Libre 3+ sensors not apply properly and is currently down to only having 1 left.  Sensors not properly ejecting from applicator.  Provided patient [employer] phone number to inform of this issue and request replacement.  I am also sending link to request replacements via MyChart message.  Patient will take 3rd dose of Ozempic  0.25mg  weekly this week.  States 1st dose did cause adverse GI effects (nausea, diarrhea), but this improved with last week's dose.  Instructed patient to only increase to 0.5mg  weekly on week 5 if these symptoms have resolved.    Telephone follow-up visit scheduled for 6/6 prior to patient's follow-up with Dr. Tilmon Font 6/10.  Linn Rich, PharmD, DPLA

## 2024-02-11 ENCOUNTER — Other Ambulatory Visit: Payer: Self-pay

## 2024-02-11 DIAGNOSIS — E1165 Type 2 diabetes mellitus with hyperglycemia: Secondary | ICD-10-CM

## 2024-02-11 MED ORDER — OZEMPIC (0.25 OR 0.5 MG/DOSE) 2 MG/3ML ~~LOC~~ SOPN
0.5000 mg | PEN_INJECTOR | SUBCUTANEOUS | 0 refills | Status: DC
Start: 2024-02-11 — End: 2024-03-13

## 2024-02-11 NOTE — Progress Notes (Signed)
   02/11/2024  Patient ID: Rema Care, female   DOB: Feb 04, 1991, 33 y.o.   MRN: [id number]  Diabetes: Current medications: Lantus  16-18 units at bedtime, metformin  500 mg twice daily, Ozempic  0.5mg  weekly -Patient was recently seen by PCP 3/10, and A1c was elevated at 8.9% -Using Libre 3+ for CGM but continues to have issue getting sensors to stay on the full 15 days.  [employer] has sent 4 replacement sensors, and she is looking into covers to help stay in place. -FBG 110-120, with BG ranging 62-156 -Patient will take second dose of Ozempic  0.5mg  weekly today- endorses tolerating mostly well, with some nausea and diarrhea after the injection -Patient has made and is maintaining lifestyle modifications, including 30 minutes of exercise daily and dietary changes to assist with lowering A1c (decreased sugar and carbohydrate intake)  Assessment/Plan:    Diabetes: -Currently uncontrolled -Decrease Lantus  to 14 units at bedtime; can decrease further to 12 units if experiencing any hypoglycemia -Refill for another month of Ozempic  0.5mg  weekly pending for Dr. Tilmon Font to sign if in agreement -Goal to increase to 1mg  once patient is no longer experiencing nausea/diarrhea after injection- in hopes to stop Lantus  all together   Follow Up Plan: 4 weeks   Linn Rich, PharmD, DPLA

## 2024-02-15 ENCOUNTER — Ambulatory Visit: Admitting: Family Medicine

## 2024-02-15 ENCOUNTER — Other Ambulatory Visit

## 2024-02-25 ENCOUNTER — Encounter: Payer: Self-pay | Admitting: Family Medicine

## 2024-02-25 ENCOUNTER — Telehealth: Payer: Self-pay | Admitting: Family Medicine

## 2024-02-25 NOTE — Telephone Encounter (Signed)
 04/13/2023 no show & 10/25/2023 no show 11/11/2023 had wrong day 02/15/2024 no show  Dismissal generated & sent via mail and mychart

## 2024-03-09 NOTE — Telephone Encounter (Unsigned)
 Copied from CRM [id number]. Topic: General - Other >> Mar 09, 2024  3:06 PM Mercedes MATSU wrote: Reason for CRM: Patient called in stating that she received a letter stating that she was dismissed from a practice due to missing her 02/15/2024 follow up appointment. Patient states that she just started a new job and got her dates confused. She does not want to be dismissed from the practice and said that she apologize for missing the appointment and it will never happen again. Patient is requesting a call back and can be reached at [phone].

## 2024-03-13 ENCOUNTER — Other Ambulatory Visit: Payer: Self-pay

## 2024-03-13 DIAGNOSIS — E1165 Type 2 diabetes mellitus with hyperglycemia: Secondary | ICD-10-CM

## 2024-03-13 MED ORDER — OZEMPIC (0.25 OR 0.5 MG/DOSE) 2 MG/3ML ~~LOC~~ SOPN: 0.5000 mg | PEN_INJECTOR | SUBCUTANEOUS | 0 refills | Status: DC

## 2024-03-13 NOTE — Addendum Note (Signed)
 Addended by: DEANNA ROSELLA A on: 03/13/2024 12:00 PM   Modules accepted: Orders

## 2024-03-13 NOTE — Progress Notes (Signed)
   03/13/2024  Patient ID: Brandy Garza, female   DOB: 12/30/1990, 33 y.o.   MRN: [id number]  Subjective/Objective:   Patient outreach to follow-up on management of diabetes  Diabetes: Current medications: metformin  500 mg twice daily, Ozempic  0.5mg  weekly -A1c was elevated at 8.9% on 3/10 -Using Libre 3+ for CGM and has found sensor covers that are working well to keep sensor on for full 15 days now -Patient has not used Lantus  over the past 2-2.5 weeks due to bedtime and FBG levels being well controlled -FBG 110-120 -Tolerating Ozempic  0.5mg  weekly mostly well with occasional diarrhea, but nausea has completely subsided -Needing a refill for Ozempic  0.5mg  -Recently released from Dr. Tresa practice and all [place] practices due to missed appointments -Patient is maintaining lifestyle modifications, including 30 minutes of exercise daily and dietary changes to assist with lowering A1c (decreased sugar and carbohydrate intake)   Assessment/Plan:    Diabetes: -Currently uncontrolled but BG readings reflect great improvement -Continue Ozempic  0.5mg  weekly and metformin  500mg  BID -Patient plans to establish care with [facility] or [facility] to continue collaboration with me and will inform me once appointment is scheduled with new PCP.  I will then see if new provider is willing to send refill for Ozempic  0.5mg  weekly.   Follow Up Plan: 4 weeks   [name], PharmD, DPLA

## 2024-03-31 ENCOUNTER — Ambulatory Visit (INDEPENDENT_AMBULATORY_CARE_PROVIDER_SITE_OTHER): Admitting: Urgent Care

## 2024-03-31 ENCOUNTER — Encounter: Payer: Self-pay | Admitting: Urgent Care

## 2024-03-31 VITALS — BP 127/85 | HR 89 | Resp 20 | Ht 67.0 in | Wt 258.4 lb

## 2024-03-31 DIAGNOSIS — M545 Low back pain, unspecified: Secondary | ICD-10-CM

## 2024-03-31 DIAGNOSIS — E7800 Pure hypercholesterolemia, unspecified: Secondary | ICD-10-CM

## 2024-03-31 DIAGNOSIS — E1165 Type 2 diabetes mellitus with hyperglycemia: Secondary | ICD-10-CM | POA: Diagnosis not present

## 2024-03-31 DIAGNOSIS — Z7984 Long term (current) use of oral hypoglycemic drugs: Secondary | ICD-10-CM

## 2024-03-31 DIAGNOSIS — R194 Change in bowel habit: Secondary | ICD-10-CM

## 2024-03-31 DIAGNOSIS — Z6841 Body Mass Index (BMI) 40.0 and over, adult: Secondary | ICD-10-CM

## 2024-03-31 NOTE — Patient Instructions (Signed)
 Diabetes Diet Guidelines:  1 serving of carbohydrates = 15g  Do not consume more than 3 servings, or 45g carbs per meal. Increase fiber!! The more fiber you eat, the less carbohydrates your body absorbs!  Corn, peas, and potatoes/ sweet potatoes are considered carbs. Non-starchy veggies preferred. Meat products and proteins to not affect sugar levels.  Eat less of the columns in yellow, eat more of the columns in green and red.    Portion sizes matter!! Eat on a smaller plate (salad plate) to help control portions. Eyeball it!! Look at the below guide to help prevent over-eating.

## 2024-03-31 NOTE — Progress Notes (Signed)
 New Patient Office Visit  Subjective:  Patient ID: Brandy Garza, female    DOB: 09/27/1990  Age: 33 y.o. MRN: [id number]  CC:  Chief Complaint  Patient presents with   New Patient (Initial Visit)    Pt would also like to have physical    HPI [name] presents to establish care.  Discussed the use of AI scribe software for clinical note transcription with the patient, who gave verbal consent to proceed.  History of Present Illness   Brandy Garza is a 33 year old female who presents for establishing care and management of her diabetes.  She has a history of gestational diabetes during her pregnancy in 2022, which was not followed up post-pregnancy, leading to a period of undiagnosed diabetes. She was diagnosed with diabetes in January 2024 after presenting with urinary symptoms and glucose in her urine. Her initial A1c was 16.7%.  Prior to her diagnosis, she experienced increased thirst, frequent urination, fatigue, and vision changes described as 'funky' without glasses. She uses a Libre device to monitor her glucose levels, which typically range from 118 to 129 mg/dL fasting and can rise to 140-150 mg/dL postprandially. She is currently on metformin  500 mg twice daily and Ozempic  0.5 mg weekly. She is not using insulin  due to concerns about hypoglycemia. Her last A1c in March was 8.9%. She follows a low-carb diet with specific carbohydrate limits per meal and snack.  She experiences chronic lower back pain since an accident, which persists despite physical therapy. She is hesitant to receive injections for pain management due to fear of needles and takes ibuprofen  for pain relief.  She had her gallbladder removed in September 2023 and reports frequent bowel movements postprandially, which she manages by eating slowly and lightly at work. She is considering workplace accommodations to manage her symptoms.  Her current medications include metformin , Ozempic , and a Mirena IUD.  She does not take medication for cholesterol, which was slightly elevated in March. 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 No family history of diabetes in her 50 year old daughter.       Outpatient Encounter Medications as of 03/31/2024  Medication Sig   Continuous Glucose Sensor (FREESTYLE LIBRE 3 PLUS SENSOR) MISC Change sensor every 15 days.   ibuprofen  (ADVIL ,MOTRIN ) 200 MG tablet Take 600 mg by mouth once as needed for headache or moderate pain.   levonorgestrel (MIRENA) 20 MCG/24HR IUD 1 each by Intrauterine route once. Inserted 2011   metFORMIN  (GLUCOPHAGE ) 500 MG tablet Take 1 tablet (500 mg total) by mouth 2 (two) times daily.   Semaglutide ,0.25 or 0.5MG /DOS, (OZEMPIC , 0.25 OR 0.5 MG/DOSE,) 2 MG/3ML SOPN Inject 0.5 mg into the skin once a week.   [DISCONTINUED] insulin  glargine (LANTUS  SOLOSTAR) 100 UNIT/ML Solostar Pen Start with 20 U s.q. nightly. (Patient not taking: Reported on 03/31/2024)   [DISCONTINUED] meloxicam  (MOBIC ) 7.5 MG tablet Take 1 tablet (7.5 mg total) by mouth daily. (Patient not taking: Reported on 03/31/2024)   [DISCONTINUED] methocarbamol  (ROBAXIN ) 500 MG tablet Take 1 tablet (500 mg total) by mouth every 8 (eight) hours as needed for muscle spasms. (Patient not taking: Reported on 03/31/2024)   No facility-administered encounter medications on file as of 03/31/2024.    Past Medical History:  Diagnosis Date   Anemia    Bronchitis    Diabetes mellitus without complication [facility])     Past Surgical History:  Procedure Laterality Date  CESAREAN SECTION     CHOLECYSTECTOMY N/A 05/30/2023   Procedure: LAPAROSCOPIC CHOLECYSTECTOMY;  Surgeon: Dasie Leonor CROME, MD;  Location: [facility] OR;  Service: General;  Laterality: N/A;   ERCP N/A 05/28/2023   Procedure: ENDOSCOPIC RETROGRADE CHOLANGIOPANCREATOGRAPHY (ERCP);  Surgeon: Aneita Gwendlyn DASEN, MD;  Location: [facility] ENDOSCOPY;  Service:  Gastroenterology;  Laterality: N/A;   REMOVAL OF STONES  05/28/2023   Procedure: REMOVAL OF STONES;  Surgeon: Aneita Gwendlyn DASEN, MD;  Location: [facility] ENDOSCOPY;  Service: Gastroenterology;;   ANNETT  05/28/2023   Procedure: SPHINCTEROTOMY;  Surgeon: Aneita Gwendlyn DASEN, MD;  Location: [facility] ENDOSCOPY;  Service: Gastroenterology;;   WISDOM TOOTH EXTRACTION Right    bottom    History reviewed. No pertinent family history.  Social History   Socioeconomic History   Marital status: Single    Spouse name: Not on file   Number of children: Not on file   Years of education: Not on file   Highest education level: Not on file  Occupational History   Not on file  Tobacco Use   Smoking status: Former   Smokeless tobacco: Never  Vaping Use   Vaping status: Never Used  Substance and Sexual Activity   Alcohol use: Yes    Comment: rarely   Drug use: No   Sexual activity: Not on file  Other Topics Concern   Not on file  Social History Narrative   Not on file   Social Drivers of Health   Financial Resource Strain: Not on file  Food Insecurity: No Food Insecurity (05/27/2023)   Hunger Vital Sign    Worried About Running Out of Food in the Last Year: Never true    Ran Out of Food in the Last Year: Never true  Transportation Needs: No Transportation Needs (05/27/2023)   PRAPARE - Administrator, Civil Service (Medical): No    Lack of Transportation (Non-Medical): No  Physical Activity: Not on file  Stress: Not on file  Social Connections: Not on file  Intimate Partner Violence: Not At Risk (05/27/2023)   Humiliation, Afraid, Rape, and Kick questionnaire    Fear of Current or Ex-Partner: No    Emotionally Abused: No    Physically Abused: No    Sexually Abused: No    ROS: as noted in HPI  Objective:  BP 127/85 (BP Location: Left Arm, Patient Position: Sitting, Cuff Size: Large)   Pulse 89   Resp 20   Ht 5' 7 (1.702 m)   Wt 258 lb 6 oz (117.2 kg)   SpO2 100%   BMI  40.47 kg/m   Physical Exam Vitals and nursing note reviewed.  Constitutional:      General: She is not in acute distress.    Appearance: Normal appearance. She is not ill-appearing, toxic-appearing or diaphoretic.  HENT:     Head: Normocephalic and atraumatic.     Right Ear: Tympanic membrane, ear canal and external ear normal. There is no impacted cerumen.     Left Ear: Tympanic membrane, ear canal and external ear normal. There is no impacted cerumen.     Nose: Nose normal.     Mouth/Throat:     Mouth: Mucous membranes are moist.     Pharynx: Oropharynx is clear. No oropharyngeal exudate or posterior oropharyngeal erythema.  Eyes:     General: No scleral icterus.       Right eye: No discharge.        Left eye: No discharge.     Extraocular  Movements: Extraocular movements intact.     Pupils: Pupils are equal, round, and reactive to light.  Neck:     Thyroid: No thyroid mass, thyromegaly or thyroid tenderness.  Cardiovascular:     Rate and Rhythm: Normal rate and regular rhythm.     Pulses: Normal pulses.     Heart sounds: No murmur heard. Pulmonary:     Effort: Pulmonary effort is normal. No respiratory distress.     Breath sounds: Normal breath sounds. No stridor. No wheezing or rhonchi.  Abdominal:     General: Abdomen is flat. Bowel sounds are normal. There is no distension.     Palpations: Abdomen is soft. There is no mass.     Tenderness: There is no abdominal tenderness. There is no guarding.  Musculoskeletal:     Cervical back: Normal range of motion and neck supple. No rigidity or tenderness.     Right lower leg: No edema.     Left lower leg: No edema.  Lymphadenopathy:     Cervical: No cervical adenopathy.  Skin:    General: Skin is warm and dry.     Coloration: Skin is not jaundiced.     Findings: No bruising, erythema or rash.  Neurological:     General: No focal deficit present.     Mental Status: She is alert and oriented to person, place, and time.      Sensory: No sensory deficit.     Motor: No weakness.  Psychiatric:        Mood and Affect: Mood normal.        Behavior: Behavior normal.     Last CBC Lab Results  Component Value Date   WBC 7.7 03/31/2024   HGB 12.4 03/31/2024   HCT 40.0 03/31/2024   MCV 84 03/31/2024   MCH 26.0 (L) 03/31/2024   RDW 13.3 03/31/2024   PLT 345 03/31/2024   Last metabolic panel Lab Results  Component Value Date   GLUCOSE 89 03/31/2024   NA 140 03/31/2024   K 4.2 03/31/2024   CL 103 03/31/2024   CO2 23 03/31/2024   BUN 8 03/31/2024   CREATININE 0.67 03/31/2024   GFR 114.38 11/15/2023   CALCIUM  9.3 03/31/2024   PROT 7.0 03/31/2024   ALBUMIN 4.4 03/31/2024   LABGLOB 2.6 03/31/2024   BILITOT <0.2 03/31/2024   ALKPHOS 95 03/31/2024   AST 15 03/31/2024   ALT 13 03/31/2024   ANIONGAP 7 05/29/2023   Last lipids Lab Results  Component Value Date   CHOL 169 03/31/2024   HDL 44 03/31/2024   LDLCALC 108 (H) 03/31/2024   TRIG 94 03/31/2024   CHOLHDL 3.8 03/31/2024   Last hemoglobin A1c Lab Results  Component Value Date   HGBA1C 7.3 (H) 03/31/2024   Last thyroid functions Lab Results  Component Value Date   TSH 0.789 03/31/2024   Last vitamin D No results found for: 25OHVITD2, 25OHVITD3, VD25OH Last vitamin B12 and Folate No results found for: VITAMINB12, FOLATE    Assessment & Plan:  Type 2 diabetes mellitus with hyperglycemia, without long-term current use of insulin  (HCC) -     CBC with Differential/Platelet -     Hemoglobin A1c -     TSH -     Comprehensive metabolic panel with GFR  Morbid obesity with BMI of 40.0-44.9, adult (HCC)  Lumbar pain  Elevated LDL cholesterol level -     Lipid panel  Frequent bowel movements  Assessment and Plan    Type  2 Diabetes Mellitus Progressed from gestational diabetes to type 2 diabetes. A1c improved from 16.7% to 8.9%. Managed with metformin  and Ozempic . Educated on carbohydrate counting and portion control. -  Check A1c today. - Significant amount of time spend on diabetic diet education and carbohydrate counting. - Continue metformin  500 mg twice daily. - Continue Ozempic  0.5 mg weekly. - Monitor blood glucose levels with Libre device.  Lower Back Pain Chronic pain exacerbated by work. Hesitant about epidural injections. Current management with ibuprofen . Discussed ergonomic support. - Discuss workplace accommodations for ergonomic support. - Advise on stretching exercises to alleviate stiffness. - pt already following with specialist, recommended discussing possible epidural  Post-Cholecystectomy Diarrhea Diarrhea postprandially since gallbladder removal. Discussed Ozempic  side effects and IBS. Suggested Metamucil trial. - Consider trial of Metamucil to bulk stools and manage diarrhea. - Discuss potential workplace accommodations for flexible work arrangements.  General Health Maintenance Slightly elevated cholesterol. Discussed dietary modifications including Mediterranean diet. - Check cholesterol levels today. - Educate on dietary changes to manage cholesterol, including the Mediterranean diet.        Return in about 4 months (around 08/01/2024).   Horn LITTIE Gave, PA

## 2024-04-01 LAB — CBC WITH DIFFERENTIAL/PLATELET
Basophils Absolute: 0 x10E3/uL (ref 0.0–0.2)
Basos: 0 %
EOS (ABSOLUTE): 0.1 x10E3/uL (ref 0.0–0.4)
Eos: 1 %
Hematocrit: 40 % (ref 34.0–46.6)
Hemoglobin: 12.4 g/dL (ref 11.1–15.9)
Immature Grans (Abs): 0 x10E3/uL (ref 0.0–0.1)
Immature Granulocytes: 0 %
Lymphocytes Absolute: 1.8 x10E3/uL (ref 0.7–3.1)
Lymphs: 23 %
MCH: 26 pg — ABNORMAL LOW (ref 26.6–33.0)
MCHC: 31 g/dL — ABNORMAL LOW (ref 31.5–35.7)
MCV: 84 fL (ref 79–97)
Monocytes Absolute: 0.5 x10E3/uL (ref 0.1–0.9)
Monocytes: 6 %
Neutrophils Absolute: 5.4 x10E3/uL (ref 1.4–7.0)
Neutrophils: 70 %
Platelets: 345 x10E3/uL (ref 150–450)
RBC: 4.77 x10E6/uL (ref 3.77–5.28)
RDW: 13.3 % (ref 11.7–15.4)
WBC: 7.7 x10E3/uL (ref 3.4–10.8)

## 2024-04-01 LAB — LIPID PANEL
Chol/HDL Ratio: 3.8 ratio (ref 0.0–4.4)
Cholesterol, Total: 169 mg/dL (ref 100–199)
HDL: 44 mg/dL (ref >39)
LDL Chol Calc (NIH): 108 mg/dL — ABNORMAL HIGH (ref 0–99)
Triglycerides: 94 mg/dL (ref 0–149)
VLDL Cholesterol Cal: 17 mg/dL (ref 5–40)

## 2024-04-01 LAB — HEMOGLOBIN A1C
Est. average glucose Bld gHb Est-mCnc: 163 mg/dL
Hgb A1c MFr Bld: 7.3 % — ABNORMAL HIGH (ref 4.8–5.6)

## 2024-04-01 LAB — COMPREHENSIVE METABOLIC PANEL WITH GFR
ALT: 13 IU/L (ref 0–32)
AST: 15 IU/L (ref 0–40)
Albumin: 4.4 g/dL (ref 3.9–4.9)
Alkaline Phosphatase: 95 IU/L (ref 44–121)
BUN/Creatinine Ratio: 12 (ref 9–23)
BUN: 8 mg/dL (ref 6–20)
Bilirubin Total: <0.2 mg/dL (ref 0.0–1.2)
CO2: 23 mmol/L (ref 20–29)
Calcium: 9.3 mg/dL (ref 8.7–10.2)
Chloride: 103 mmol/L (ref 96–106)
Creatinine, Ser: 0.67 mg/dL (ref 0.57–1.00)
Globulin, Total: 2.6 g/dL (ref 1.5–4.5)
Glucose: 89 mg/dL (ref 70–99)
Potassium: 4.2 mmol/L (ref 3.5–5.2)
Sodium: 140 mmol/L (ref 134–144)
Total Protein: 7 g/dL (ref 6.0–8.5)
eGFR: 118 mL/min/1.73 (ref >59)

## 2024-04-01 LAB — TSH: TSH: 0.789 u[IU]/mL (ref 0.450–4.500)

## 2024-04-03 ENCOUNTER — Ambulatory Visit: Payer: Self-pay | Admitting: Urgent Care

## 2024-04-03 DIAGNOSIS — E7800 Pure hypercholesterolemia, unspecified: Secondary | ICD-10-CM

## 2024-04-03 DIAGNOSIS — E1165 Type 2 diabetes mellitus with hyperglycemia: Secondary | ICD-10-CM

## 2024-04-03 MED ORDER — METFORMIN HCL 500 MG PO TABS: 1000.0000 mg | ORAL_TABLET | Freq: Two times a day (BID) | ORAL | 1 refills | Status: DC

## 2024-04-03 MED ORDER — ATORVASTATIN CALCIUM 10 MG PO TABS: 10.0000 mg | ORAL_TABLET | Freq: Every day | ORAL | 1 refills | Status: AC

## 2024-04-10 ENCOUNTER — Other Ambulatory Visit: Payer: Self-pay

## 2024-04-10 NOTE — Progress Notes (Unsigned)
   04/10/2024  Patient ID: Brandy Garza, female   DOB: Jun 24, 1991, 33 y.o.   MRN: [id number]  Subjective/Objective:   Patient outreach to follow-up on management of diabetes   Diabetes: Current medications: metformin  500 mg twice daily, Ozempic  0.5mg  weekly -A1c 7.3%, down from 8.9% -Using Libre 3+ for CGM and has found sensor covers that are working well to keep sensor on for full 15 days now -Patient has not used Lantus  over the past 2-2.5 weeks due to bedtime and FBG levels being well controlled -FBG 110-120 -Tolerating Ozempic  0.5mg  weekly mostly well with occasional diarrhea, but nausea has completely subsided -Needing a refill for Ozempic  0.5mg  -Recently released from Dr. Tresa practice and all [place] practices due to missed appointments -Patient is maintaining lifestyle modifications, including 30 minutes of exercise daily and dietary changes to assist with lowering A1c (decreased sugar and carbohydrate intake)   Assessment/Plan:    Diabetes: -Currently uncontrolled but BG readings reflect great improvement -Continue Ozempic  0.5mg  weekly and metformin  500mg  BID -Patient plans to establish care with [facility] to continue collaboration with me and will inform me once appointment is scheduled with new PCP.  I will then see if new provider is willing to send refill for Ozempic  0.5mg  weekly.   Follow Up Plan: 4 weeks   [name], PharmD, DPLA

## 2024-04-11 ENCOUNTER — Other Ambulatory Visit: Payer: Self-pay

## 2024-04-11 DIAGNOSIS — E1165 Type 2 diabetes mellitus with hyperglycemia: Secondary | ICD-10-CM

## 2024-04-11 MED ORDER — SEMAGLUTIDE (1 MG/DOSE) 4 MG/3ML ~~LOC~~ SOPN
1.0000 mg | PEN_INJECTOR | SUBCUTANEOUS | 1 refills | Status: DC
Start: 2024-04-11 — End: 2024-06-13

## 2024-04-11 NOTE — Progress Notes (Unsigned)
   04/11/2024  Patient ID: Brandy Garza, female   DOB: 06-28-1991, 33 y.o.   MRN: [id number]  Subjective/Objective:   Patient outreach to follow-up on management of diabetes   Diabetes: Current medications: metformin  500 mg twice daily, Ozempic  0.5mg  weekly -A1c 7.3%, down from 8l.9% -Using Libre 3+ for CGM -sending invite to link to [employer] -FBG 90-120 -Metformin  dose recently increased to 1000mg  BID, but patient was not able to tolerate due to stomach upset; so she has decreased dose back down to 500mg  BID -Tolerating Ozempic  0.5mg  weekly well and is out of medication at this time -Patient is being mindful of carbohydrate intake as well as making sure to have protein choices along with carbohydrates to slow breakdown of these into sugar -Doe snot endorse andy s/sx of hypoglycemia  Hyperlipidemia: Current medications:  atorvastatin  10mg  prescribed based on LDL of 108 in patient with diabetes, but she has not yet started taking this medications  Assessment/Plan:    Diabetes: -Improved control -Recommend increasing Ozempic  to 1mg  weekly; can prescribe Prn ondansetron  if needed for any nausea associated with dose change -Continue metformin  500mg  BID -Continue lifestyle modifications of decreased carbohydrate/sugar intake -Encouraged used of metamucil if needed to bulk stool if diarrhea occurs.  Increase water intake. -Will be due for A1c at next PCP visit  Hyperlipidemia: -Begin taking atorvastatin  10mg  daily  -Recommend follow-up lipid panel and BMP (look at LFT's) at next PCP visit   Follow Up Plan: 4 weeks   [name], PharmD, DPLA

## 2024-05-09 ENCOUNTER — Other Ambulatory Visit: Payer: Self-pay

## 2024-05-09 DIAGNOSIS — E1165 Type 2 diabetes mellitus with hyperglycemia: Secondary | ICD-10-CM

## 2024-05-09 MED ORDER — ACCU-CHEK GUIDE ME W/DEVICE KIT: PACK | 0 refills | Status: AC

## 2024-05-09 MED ORDER — ACCU-CHEK GUIDE TEST VI STRP: ORAL_STRIP | 12 refills | Status: AC

## 2024-05-09 MED ORDER — ACCU-CHEK SOFTCLIX LANCETS MISC: 12 refills | Status: AC

## 2024-05-09 NOTE — Progress Notes (Signed)
   05/09/2024  Patient ID: Brandy Garza, female   DOB: 20-Jan-1991, 33 y.o.   MRN: [id number]  Subjective/Objective:   Patient outreach to follow-up on management of diabetes   Diabetes: Current medications: metformin  500 mg twice daily, Ozempic  1mg  weekly -A1c 7.3% on 7/25, down from 8.9% -Using Libre 3+ for CGM -sent invite to link to [employer] -FBG 100-110, post-prandial 140-150 -Metformin  dose recently increased to 1000mg  BID, but patient was not able to tolerate due to stomach upset; so she has decreased dose back down to 500mg  BID and will skip evening dose of this if BG is not elevated -Does endorse 1 day with multiple  BG readings in the 60's and states she was clammy/sweaty.  Would like glucometer and testing supplies to be able to verify lows. -Ozempic  was increased to 1mg  4 weeks ago and she is tolerating this dose increase well- states she did initially have some nausea the day after her injection the first couple of weeks -Patient is being mindful of carbohydrate intake as well as making sure to have protein choices along with carbohydrates to slow breakdown of these into sugar  Hyperlipidemia: Current medications:  atorvastatin  10mg   -She has started taking this medication daily -Last LDL was elevated at 108  Assessment/Plan:    Diabetes: -Improved control based on A1c and home BG readings -Continue current regimen at this time -Continue lifestyle modifications of decreased carbohydrate/sugar intake -Discussed s/sx of hypoglycemia and how to address; informed patient that neither Ozempic  nor metformin  should cause hypoglycemia based on how these medications work -Sent orders for [employer], test strips and Softclix lancets to patient's pharmacy under [facility] standing order- use this as needed to verify hypoglycemia -Will be due for A1c at next PCP visit  Hyperlipidemia: -Continue atorvastatin  10mg  daily  -Recommend follow-up lipid panel and BMP  (look at LFT's) at next PCP visit   Follow Up Plan: 1 month   [name], PharmD, DPLA

## 2024-06-12 NOTE — Progress Notes (Unsigned)
   06/13/2024  Patient ID: Brandy Garza, female   DOB: 06-13-1991, 33 y.o.   MRN: [id number]  Subjective/Objective:   Patient outreach to follow-up on management of diabetes   Diabetes: Current medications: metformin  500 mg twice daily, Ozempic  1mg  weekly -A1c 7.3% on 7/25, down from 8.9% -Using Libre 3+ for CGM -data has been linked to [employer], and the last 14 days reflect: Time CGM active:  97% Average glucose:  101 GMI 5.7% Glucose variability:  16.6% Time in target range:  100% -Metformin  dose recently increased to 1000mg  BID, but patient was not able to tolerate due to stomach upset; so she has decreased dose back down to 500mg  BID  -Ozempic  was increased to 1mg  approximately 2 months ago, and she is tolerating this dose increase well with no adverse side effects -Patient is being mindful of carbohydrate intake as well as making sure to have protein choices along with carbohydrates to slow breakdown of these into sugar -Patient did receive [employer] testing supplies to keep on hand in case fingerstick to check blood sugar is needed -ACEi/ARB on board for cardiorenal protection:  no -Statin on board for ASCVD risk reduction:  atorvastatin  10mg  daily  Hyperlipidemia: Current medications:  atorvastatin  10mg   -She has started taking this medication daily -Last LDL was elevated at 108  Assessment/Plan:    Diabetes: -Controlled based on home BG data -BP is at goal -LDL is not at goal -UACR is at goal -Continue current regimen at this time -Continue lifestyle modifications of decreased carbohydrate/sugar intake -Will be due for A1c at next PCP visit; could consider increasing Ozempic  to 2mg  weekly for additional weight loss benefit if patient interested.  If Ozempic  is increased, could consider stopping metformin  since Ozempic  offers cardiorenal benefit.  Hyperlipidemia: -Continue atorvastatin  10mg  daily  -Recommend follow-up lipid panel and BMP (look at LFT's)  at next PCP visit: if LDL remains >70, consider increasing atorvastatin  to 20mg  daily    Follow Up Plan: 12/22   [name], PharmD, DPLA

## 2024-06-13 ENCOUNTER — Other Ambulatory Visit: Payer: Self-pay

## 2024-06-13 MED ORDER — SEMAGLUTIDE (1 MG/DOSE) 4 MG/3ML ~~LOC~~ SOPN: 1.0000 mg | PEN_INJECTOR | SUBCUTANEOUS | 1 refills | Status: DC

## 2024-06-21 ENCOUNTER — Other Ambulatory Visit: Payer: Self-pay | Admitting: Urgent Care

## 2024-06-21 MED ORDER — FREESTYLE LIBRE 3 PLUS SENSOR MISC: 1 refills | Status: DC

## 2024-06-21 NOTE — Telephone Encounter (Signed)
 Copied from CRM #[id number]. Topic: Clinical - Medication Refill >> Jun 21, 2024 10:37 AM Gustabo D wrote: Medication: Continuous Glucose Sensor (FREESTYLE LIBRE 3 PLUS SENSOR) MISC  Has the patient contacted their pharmacy? Yes they say are waiting on approval  (Agent: If no, request that the patient contact the pharmacy for the refill. If patient does not wish to contact the pharmacy document the reason why and proceed with request.) (Agent: If yes, when and what did the pharmacy advise?)  This is the patient's preferred pharmacy:  [id number] [place], [place] - [address] AT [facility] & [address] [place] [address] Phone: [phone] Fax: [phone]  Is this the correct pharmacy for this prescription? Yes If no, delete pharmacy and type the correct one.   Has the prescription been filled recently? No  Is the patient out of the medication? Yes  Has the patient been seen for an appointment in the last year OR does the patient have an upcoming appointment? Yes  Can we respond through MyChart? Yes  Agent: Please be advised that Rx refills may take up to 3 business days. We ask that you follow-up with your pharmacy.

## 2024-06-22 ENCOUNTER — Other Ambulatory Visit (HOSPITAL_COMMUNITY): Payer: Self-pay

## 2024-06-22 ENCOUNTER — Telehealth: Payer: Self-pay

## 2024-06-22 NOTE — Telephone Encounter (Signed)
 Pharmacy Patient Advocate Encounter   Received notification from Patient Pharmacy that prior authorization for Freestyle Libre 3 Plus sensor is required/requested.   Insurance verification completed.   The patient is insured through [facility] MEDICAID.   Per test claim: PA required; PA submitted to above mentioned insurance via Latent Key/confirmation #/EOC [id number] Status is pending

## 2024-06-22 NOTE — Telephone Encounter (Signed)
 Pharmacy Patient Advocate Encounter  Received notification from [facility] MEDICAID that Prior Authorization for [facility] 3 plus sensor has been APPROVED from 06/22/24 to 06/22/25   PA #/Case ID/Reference #: [id number]

## 2024-06-23 ENCOUNTER — Other Ambulatory Visit (HOSPITAL_COMMUNITY): Payer: Self-pay

## 2024-07-10 ENCOUNTER — Encounter: Payer: Self-pay | Admitting: Radiology

## 2024-08-01 ENCOUNTER — Encounter: Payer: Self-pay | Admitting: Urgent Care

## 2024-08-01 ENCOUNTER — Ambulatory Visit: Admitting: Urgent Care

## 2024-08-01 VITALS — BP 122/78 | HR 77 | Ht 67.0 in | Wt 242.0 lb

## 2024-08-01 DIAGNOSIS — E1165 Type 2 diabetes mellitus with hyperglycemia: Secondary | ICD-10-CM | POA: Diagnosis not present

## 2024-08-01 DIAGNOSIS — E7800 Pure hypercholesterolemia, unspecified: Secondary | ICD-10-CM | POA: Diagnosis not present

## 2024-08-01 DIAGNOSIS — Z7984 Long term (current) use of oral hypoglycemic drugs: Secondary | ICD-10-CM

## 2024-08-01 DIAGNOSIS — R194 Change in bowel habit: Secondary | ICD-10-CM

## 2024-08-01 DIAGNOSIS — M519 Unspecified thoracic, thoracolumbar and lumbosacral intervertebral disc disorder: Secondary | ICD-10-CM | POA: Diagnosis not present

## 2024-08-01 MED ORDER — METFORMIN HCL ER 500 MG PO TB24: 1000.0000 mg | ORAL_TABLET | Freq: Two times a day (BID) | ORAL | 3 refills | Status: AC

## 2024-08-01 MED ORDER — BACLOFEN 10 MG PO TABS
10.0000 mg | ORAL_TABLET | Freq: Three times a day (TID) | ORAL | 0 refills | Status: AC
Start: 2024-08-01 — End: ?
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
## 2024-08-01 NOTE — Patient Instructions (Signed)
 Change to metformin  XR. Take two twice daily with food.  Continue all meds as otherwise ordered.  Please schedule follow up with the eye specialist.  Please follow up with PT for your back.  Come see me again in 4 months

## 2024-08-02 ENCOUNTER — Ambulatory Visit: Payer: Self-pay | Admitting: Urgent Care

## 2024-08-02 LAB — LIPID PANEL
Chol/HDL Ratio: 2.8 ratio (ref 0.0–4.4)
Cholesterol, Total: 110 mg/dL (ref 100–199)
HDL: 40 mg/dL (ref >39)
LDL Chol Calc (NIH): 55 mg/dL (ref 0–99)
Triglycerides: 72 mg/dL (ref 0–149)
VLDL Cholesterol Cal: 15 mg/dL (ref 5–40)

## 2024-08-02 LAB — BASIC METABOLIC PANEL WITH GFR
BUN/Creatinine Ratio: 16 (ref 9–23)
BUN: 10 mg/dL (ref 6–20)
CO2: 24 mmol/L (ref 20–29)
Calcium: 9.5 mg/dL (ref 8.7–10.2)
Chloride: 102 mmol/L (ref 96–106)
Creatinine, Ser: 0.64 mg/dL (ref 0.57–1.00)
Glucose: 103 mg/dL — ABNORMAL HIGH (ref 70–99)
Potassium: 4.2 mmol/L (ref 3.5–5.2)
Sodium: 138 mmol/L (ref 134–144)
eGFR: 118 mL/min/1.73 (ref >59)

## 2024-08-02 LAB — HEMOGLOBIN A1C
Est. average glucose Bld gHb Est-mCnc: 131 mg/dL
Hgb A1c MFr Bld: 6.2 % — ABNORMAL HIGH (ref 4.8–5.6)

## 2024-08-07 MED ORDER — SEMAGLUTIDE (2 MG/DOSE) 8 MG/3ML ~~LOC~~ SOPN: 2.0000 mg | PEN_INJECTOR | SUBCUTANEOUS | 5 refills | Status: AC

## 2024-08-26 NOTE — Progress Notes (Unsigned)
" ° °  08/28/24  Patient ID: Brandy Garza, female   DOB: 02/26/1991, 33 y.o.   MRN: [id number]  Subjective/Objective:   Patient outreach to follow-up on management of diabetes   Diabetes: Current medications: metformin  1000mg  BID, Ozempic  2mg  weekly -Metformin  was recently changed to XR tablets to help with GI side effects, but patient has not received this dosage form yet -Will take 3rd dose of Ozempic  2mg  this Wednesday- tolerating mostly well, but does experience diarrhea that seems to have started after gallbladder removal (before she started Ozempic ) -Using Libre 3+ for CGM -data has been linked to [employer], and the last 14 days reflect: Time CGM active:  78% Average glucose:  98 GMI 5.7% Glucose variability:  14.7% Time in target range:  100% -Patient is being mindful of carbohydrate intake as well as making sure to have protein choices along with carbohydrates to slow breakdown of these into sugar.  She has lost approximately 25lbs since starting Ozempic  in March -Patient did receive [employer] testing supplies to keep on hand in case fingerstick to check blood sugar is needed -ACEi/ARB on board for cardiorenal protection:  no, not indicated at this time based on BP and UACR -UACR 4.9 on 11/15/23 -Statin on board for ASCVD risk reduction:  atorvastatin  10mg  daily -Patient will lose Medicaid 09/06/2024, but she will be enrolling in insurance through employer- advised her to refill all medications possible before EOY; so we have time to navigate refills on new insurance (in case PA's required)  Lab Results  Component Value Date   HGBA1C 6.2 (H) 08/01/2024   HGBA1C 7.3 (H) 03/31/2024   HGBA1C 8.9 (H) 11/15/2023    Hyperlipidemia: Current medications:  atorvastatin  10mg   -She has started taking this medication daily -LDL has improved significantly with regular atorvastatin  10mg  use     Component Value Date/Time   CHOL 110 08/01/2024 0846   TRIG 72 08/01/2024 0846    HDL 40 08/01/2024 0846   CHOLHDL 2.8 08/01/2024 0846   CHOLHDL 4 11/15/2023 1429   VLDL 15.8 11/15/2023 1429   LDLCALC 55 08/01/2024 0846   LABVLDL 15 08/01/2024 0846    Assessment/Plan:    Diabetes: -Controlled with A1c<7% -BP is at goal -LDL is at goal -UACR is at goal Brandy Garza, and they are filling metformin  XR 500mg  (2 tablets BID); and they have queued Ozempic  2mg  to fill on 12/24 (when insurance will cover again).   -Continue lifestyle modifications of decreased carbohydrate/sugar intake -So long as new insurance will continue to cover Ozmepic at affordable copay, we could consider stopping metformin  to see if this would help decrease diarrhea occurrences in the future  Hyperlipidemia: -Controlled -Continue current regimen   Follow Up Plan: 4 weeks   Brandy Garza Brandy Garza, PharmD, DPLA    "

## 2024-08-28 ENCOUNTER — Other Ambulatory Visit (INDEPENDENT_AMBULATORY_CARE_PROVIDER_SITE_OTHER): Payer: Self-pay

## 2024-08-28 DIAGNOSIS — E1165 Type 2 diabetes mellitus with hyperglycemia: Secondary | ICD-10-CM

## 2024-08-28 DIAGNOSIS — Z7984 Long term (current) use of oral hypoglycemic drugs: Secondary | ICD-10-CM

## 2024-08-28 DIAGNOSIS — E7800 Pure hypercholesterolemia, unspecified: Secondary | ICD-10-CM

## 2024-09-02 ENCOUNTER — Emergency Department (HOSPITAL_COMMUNITY)
Admission: EM | Admit: 2024-09-02 | Discharge: 2024-09-02 | Disposition: A | Discharge status: Home / Self Care | Attending: Emergency Medicine | Admitting: Emergency Medicine

## 2024-09-02 ENCOUNTER — Other Ambulatory Visit: Payer: Self-pay

## 2024-09-02 ENCOUNTER — Emergency Department (HOSPITAL_COMMUNITY)

## 2024-09-02 ENCOUNTER — Encounter (HOSPITAL_COMMUNITY): Payer: Self-pay | Admitting: Emergency Medicine

## 2024-09-02 DIAGNOSIS — R000 Tachycardia, unspecified: Secondary | ICD-10-CM | POA: Insufficient documentation

## 2024-09-02 DIAGNOSIS — Z7984 Long term (current) use of oral hypoglycemic drugs: Secondary | ICD-10-CM | POA: Insufficient documentation

## 2024-09-02 DIAGNOSIS — Z91040 Latex allergy status: Secondary | ICD-10-CM | POA: Diagnosis not present

## 2024-09-02 DIAGNOSIS — Z79899 Other long term (current) drug therapy: Secondary | ICD-10-CM | POA: Diagnosis not present

## 2024-09-02 DIAGNOSIS — J101 Influenza due to other identified influenza virus with other respiratory manifestations: Secondary | ICD-10-CM | POA: Insufficient documentation

## 2024-09-02 DIAGNOSIS — E119 Type 2 diabetes mellitus without complications: Secondary | ICD-10-CM | POA: Diagnosis not present

## 2024-09-02 DIAGNOSIS — R509 Fever, unspecified: Secondary | ICD-10-CM | POA: Diagnosis present

## 2024-09-02 LAB — RESP PANEL BY RT-PCR (RSV, FLU A&B, COVID)  RVPGX2
Influenza A by PCR: POSITIVE — AB
Influenza B by PCR: NEGATIVE
Resp Syncytial Virus by PCR: NEGATIVE
SARS Coronavirus 2 by RT PCR: NEGATIVE

## 2024-09-02 MED ORDER — ONDANSETRON 4 MG PO TBDP: 4.0000 mg | ORAL_TABLET | Freq: Once | ORAL | Status: DC

## 2024-09-02 MED ORDER — ONDANSETRON HCL 4 MG/2ML IJ SOLN: 4.0000 mg | Freq: Once | INTRAMUSCULAR | Status: DC

## 2024-09-02 MED ORDER — ONDANSETRON 4 MG PO TBDP: 4.0000 mg | ORAL_TABLET | Freq: Three times a day (TID) | ORAL | 0 refills | Status: AC | PRN

## 2024-09-02 MED ORDER — ONDANSETRON 4 MG PO TBDP
4.0000 mg | ORAL_TABLET | Freq: Once | ORAL | Status: AC
  Administered 2024-09-02: 4 mg via ORAL
  Filled 2024-09-02: qty 1

## 2024-09-02 MED ORDER — SODIUM CHLORIDE 0.9 % IV BOLUS: 1000.0000 mL | Freq: Once | INTRAVENOUS | Status: DC

## 2024-09-02 MED ORDER — ACETAMINOPHEN 325 MG PO TABS
975.0000 mg | ORAL_TABLET | Freq: Once | ORAL | Status: AC
  Administered 2024-09-02: 975 mg via ORAL
  Filled 2024-09-02: qty 3

## 2024-09-02 MED ORDER — BENZONATATE 100 MG PO CAPS: 100.0000 mg | ORAL_CAPSULE | Freq: Three times a day (TID) | ORAL | 0 refills | Status: AC | PRN

## 2024-09-02 NOTE — ED Triage Notes (Signed)
 Pt bib EMS from home. Pt c/o flu like symptoms with N/V/D. Started around 3 days ago. 166/110 104 Fever took tylenol  99.6 with EMS 115HR CBG 109

## 2024-09-02 NOTE — Discharge Instructions (Signed)
 You were seen in the ER today for your flulike symptoms.  You have influenza A.  You have been prescribed a nausea medication which may use at home as needed for your nausea, you may use over-the-counter antidiarrheal medication such as Imodium.  Please increase your hydration and return to the ER with any severe symptoms.

## 2024-09-02 NOTE — ED Provider Notes (Signed)
 " [place] EMERGENCY DEPARTMENT AT [facility] Provider Note   CSN: [id number] Arrival date & time: 09/02/24  1710     Patient presents with: Influenza   Brandy Garza is a 33 y.o. female who presents with flu-like symptoms with N/V/D, fevers, fatigue x 3 days. No known ill contacts. Hx of DMT2.  Fever at home, tylenol  prior to arrival.    HPI     Prior to Admission medications  Medication Sig Start Date End Date Taking? Authorizing Provider  ondansetron  (ZOFRAN -ODT) 4 MG disintegrating tablet Take 1 tablet (4 mg total) by mouth every 8 (eight) hours as needed for nausea or vomiting. 09/02/24  Yes Micael Barb, Pleasant SAUNDERS, PA-C  Accu-Chek Softclix Lancets lancets Use to check blood glucose daily 05/09/24   Crain, Whitney L, PA  atorvastatin  (LIPITOR) 10 MG tablet Take 1 tablet (10 mg total) by mouth daily. 04/03/24   Crain, Whitney L, PA  baclofen  (LIORESAL ) 10 MG tablet Take 1 tablet (10 mg total) by mouth 3 (three) times daily. 08/01/24   Crain, Whitney L, PA  Blood Glucose Monitoring Suppl (ACCU-CHEK GUIDE ME) w/Device KIT Use to check blood glucose daily Preferred Accu-chek meters are billed using: BIN [id number], PCN [id number], Group [id number], ID [id number] 05/09/24   Crain, Whitney L, PA  Continuous Glucose Sensor (FREESTYLE LIBRE 3 PLUS SENSOR) MISC Change sensor every 15 days. 06/21/24   Crain, Whitney L, PA  glucose blood (ACCU-CHEK GUIDE TEST) test strip Use to check blood glucose daily 05/09/24   Crain, Whitney L, PA  ibuprofen  (ADVIL ,MOTRIN ) 200 MG tablet Take 600 mg by mouth once as needed for headache or moderate pain.    [provider]  levonorgestrel (MIRENA) 20 MCG/24HR IUD 1 each by Intrauterine route once. Inserted 2011    [provider]  metFORMIN  (GLUCOPHAGE -XR) 500 MG 24 hr tablet Take 2 tablets (1,000 mg total) by mouth 2 (two) times daily with a meal. 08/01/24   Crain, Whitney L, PA  Semaglutide , 2 MG/DOSE, 8 MG/3ML SOPN Inject 2 mg as directed once  a week. 08/07/24   Crain, Kutscher L, PA    Allergies: Latex and Flagyl [metronidazole hcl]    Review of Systems  Constitutional:  Positive for activity change, appetite change, chills, fatigue and fever.  HENT:  Positive for congestion.   Respiratory:  Positive for cough. Negative for chest tightness and shortness of breath.   Cardiovascular: Negative.   Gastrointestinal:  Positive for diarrhea, nausea and vomiting. Negative for abdominal pain.  Genitourinary: Negative.   Musculoskeletal:  Positive for myalgias.  Skin: Negative.   Neurological: Negative.     Updated Vital Signs BP (!) 130/92   Pulse 91   Temp 98.3 F (36.8 C) (Oral)   Resp 17   Ht 5' 7 (1.702 m)   LMP 08/30/2024   SpO2 99%   BMI 37.90 kg/m   Physical Exam Vitals and nursing note reviewed.  Constitutional:      Appearance: She is not ill-appearing or toxic-appearing.  HENT:     Head: Normocephalic and atraumatic.     Mouth/Throat:     Mouth: Mucous membranes are moist.     Pharynx: No oropharyngeal exudate or posterior oropharyngeal erythema.  Eyes:     General:        Right eye: No discharge.        Left eye: No discharge.     Extraocular Movements: Extraocular movements intact.     Conjunctiva/sclera: Conjunctivae normal.  Pupils: Pupils are equal, round, and reactive to light.  Cardiovascular:     Rate and Rhythm: Normal rate and regular rhythm.     Pulses: Normal pulses.     Heart sounds: Normal heart sounds. No murmur heard. Pulmonary:     Effort: Pulmonary effort is normal. No tachypnea, bradypnea, accessory muscle usage or respiratory distress.     Breath sounds: Normal breath sounds. No wheezing or rales.  Chest:     Chest wall: No mass, lacerations, deformity, swelling, tenderness or crepitus.  Abdominal:     General: Bowel sounds are increased. There is no distension.     Palpations: Abdomen is soft.     Tenderness: There is no abdominal tenderness. There is no guarding or rebound.   Musculoskeletal:        General: No deformity.     Cervical back: Neck supple.     Right lower leg: No edema.     Left lower leg: No edema.  Skin:    General: Skin is warm and dry.     Capillary Refill: Capillary refill takes less than 2 seconds.     Findings: No rash.  Neurological:     General: No focal deficit present.     Mental Status: She is alert and oriented to person, place, and time. Mental status is at baseline.  Psychiatric:        Mood and Affect: Mood normal.     (all labs ordered are listed, but only abnormal results are displayed) Labs Reviewed  RESP PANEL BY RT-PCR (RSV, FLU A&B, COVID)  RVPGX2 - Abnormal; Notable for the following components:      Result Value   Influenza A by PCR POSITIVE (*)    All other components within normal limits    EKG: None  Radiology: DG Chest 2 View Result Date: 09/02/2024 EXAM: 2 VIEW(S) XRAY OF THE CHEST 09/02/2024 08:28:09 PM COMPARISON: 06/10/2023 CLINICAL HISTORY: r/o pna FINDINGS: LUNGS AND PLEURA: No focal pulmonary opacity. No pleural effusion. No pneumothorax. HEART AND MEDIASTINUM: No acute abnormality of the cardiac and mediastinal silhouettes. BONES AND SOFT TISSUES: No acute osseous abnormality. IMPRESSION: 1. No acute process. Electronically signed by: Morgane Naveau MD 09/02/2024 09:17 PM EST RP Workstation: [id number]     Procedures   Medications Ordered in the ED  acetaminophen  (TYLENOL ) tablet 975 mg (975 mg Oral Given 09/02/24 2033)  ondansetron  (ZOFRAN -ODT) disintegrating tablet 4 mg (4 mg Oral Given 09/02/24 2335)                                    Medical Decision Making 32 year old female flulike symptoms.  Febrile and mildly tachycardic in ED, but is otherwise reassuring.  Cardiopulmonary unremarkable, abdominal exam is benign.  Increased bowel sounds.  DDx includes not limited to viral gastroenteritis, colitis, diverticulitis, appendicitis, IBD.  Amount and/or Complexity of Data  Reviewed Labs:     Details: RVP positive for influenza A. Radiology:     Details: Chest x-ray negative.  Risk Prescription drug management.   Clinical picture most consistent with diagnosed influenza A infection on workup today.  Patient is clinically very well-appearing.  Supportive care recommended.  Sarahjane voiced understanding of her medical evaluation and treatment plan. Each of their questions answered to their expressed satisfaction.  Return precautions were given.  Patient is well-appearing, stable, and was discharged in good condition.  This chart was dictated using voice recognition software,  Dragon. Despite the best efforts of this provider to proofread and correct errors, errors may still occur which can change documentation meaning.      Final diagnoses:  Influenza A    ED Discharge Orders          Ordered    ondansetron  (ZOFRAN -ODT) 4 MG disintegrating tablet  Every 8 hours PRN        09/02/24 2332               Takumi Din, Pleasant SAUNDERS, PA-C 09/02/24 2341  "

## 2024-09-02 NOTE — ED Provider Triage Note (Signed)
 Emergency Medicine Provider Triage Evaluation Note  Brandy Garza , a 33 y.o. female  was evaluated in triage.  Pt complains of congestion, cough, body aches, NVD for past 3 days. Ibuprofen  for body aches. Boyfriend starting to have similar symptoms. Tmax 103.60F.  Today presented because has continued NVD. Difficulty keeping food down  Review of Systems  Positive: See hpi Negative:   Physical Exam  BP (!) 129/102 (BP Location: Right Arm)   Pulse (!) 106   Temp 100 F (37.8 C) (Oral)   Resp 18   Ht 5' 7 (1.702 m)   LMP 08/30/2024   SpO2 99%   BMI 37.90 kg/m  Gen:   Awake, no distress   Resp:  Normal effort  MSK:   Moves extremities without difficulty  Other:    Medical Decision Making  Medically screening exam initiated at 8:10 PM.  Appropriate orders placed.  Brandy Garza was informed that the remainder of the evaluation will be completed by another provider, this initial triage assessment does not replace that evaluation, and the importance of remaining in the ED until their evaluation is complete.  Cxr, zofran , and IVF ordered   Brandy Garza, Brandy Garza 09/02/24 2017

## 2024-09-15 ENCOUNTER — Telehealth: Payer: Self-pay

## 2024-09-15 NOTE — Telephone Encounter (Signed)
 Received FMLA paperwork for patient.   We are unsure how to fill this out. Left message for a return call from patient for clarification on what this claim is for and what dates it involves.

## 2024-09-22 NOTE — Telephone Encounter (Signed)
 Sent MyChart message to patient requesting that she contact the office or respond to the message with clarification.

## 2024-09-27 ENCOUNTER — Other Ambulatory Visit (INDEPENDENT_AMBULATORY_CARE_PROVIDER_SITE_OTHER): Payer: Self-pay

## 2024-09-27 ENCOUNTER — Telehealth: Payer: Self-pay | Admitting: Pharmacy Technician

## 2024-09-27 DIAGNOSIS — Z7984 Long term (current) use of oral hypoglycemic drugs: Secondary | ICD-10-CM

## 2024-09-27 DIAGNOSIS — E1165 Type 2 diabetes mellitus with hyperglycemia: Secondary | ICD-10-CM

## 2024-09-27 MED ORDER — DEXCOM G7 SENSOR MISC: 11 refills | Status: AC

## 2024-09-27 NOTE — Progress Notes (Signed)
" ° °  09/27/24  Patient ID: Brandy Garza, female   DOB: 01-07-1991, 33 y.o.   MRN: [id number]  Subjective/Objective:   Patient outreach to follow-up on management of diabetes   Diabetes: Current medications: metformin  XR 1000mg  BID, Ozempic  2mg  weekly -Patient recently lost Medicaid coverage and has not received new insurance information from her employer, so she has not been able to refill Ozempic  2mg  and did miss last week's dose due on Friday. -She will also need a refill on Libre 3+ sensors soon -She does have metformin  and is taking as prescribed -Using Libre 3+ for CGM -data has been linked to [employer], and the last 14 days reflect: Time CGM active:  100% Average glucose:  100 GMI 5.7% Glucose variability:  16.9% Time in target range:  100% -Patient is being mindful of carbohydrate intake as well as making sure to have protein choices along with carbohydrates to slow breakdown of these into sugar. -Patient did receive Accu Chek testing supplies to keep on hand in case fingerstick to check blood sugar is needed -ACEi/ARB on board for cardiorenal protection:  no, not indicated at this time based on BP and UACR -UACR 4.9 on 11/15/23 -Statin on board for ASCVD risk reduction:  atorvastatin  10mg  daily  Lab Results  Component Value Date   HGBA1C 6.2 (H) 08/01/2024   HGBA1C 7.3 (H) 03/31/2024   HGBA1C 8.9 (H) 11/15/2023       Component Value Date/Time   CHOL 110 08/01/2024 0846   TRIG 72 08/01/2024 0846   HDL 40 08/01/2024 0846   CHOLHDL 2.8 08/01/2024 0846   CHOLHDL 4 11/15/2023 1429   VLDL 15.8 11/15/2023 1429   LDLCALC 55 08/01/2024 0846   LABVLDL 15 08/01/2024 0846    Assessment/Plan:    Diabetes: -Controlled with A1c<7% -BP is at goal -LDL is at goal -UACR is at goal -System was able to pull in current prescription insurance information, and plan requires a PA for Ozempic  2mg .  This has been submitted via CoverMyMeds (Key [id number]), and the request has  been approved. -Patient's new insurance does not cover Libre 3, but they do cover Dexcom.  Sending orders for Dexcom G7 sensors to patients pharmacy. -Coordinating with Cpht to contact pharmacy to provide patient's new insurance information and check copays for Ozempic  and Dexcom Sensors  Follow Up Plan: Will send MyChart message to patient to follow-up on refill status of Ozempic  and CGM sensors and will schedule 3 month telephone follow-up visit   [name], PharmD, DPLA    "

## 2024-09-27 NOTE — Progress Notes (Signed)
" ° °  09/27/2024 Name: Brandy Garza MRN: [id number] DOB: 08-28-1991  Patient is appearing for a follow-up visit with the population health pharmacy technician. Last engaged with the clinical pharmacist to discuss diabetes on 09/27/2024. Contacted pharmacy today to discuss medication access.   Plan from last clinical pharmacist appointment:  Diabetes: -Controlled with A1c<7% -BP is at goal -LDL is at goal -UACR is at goal -System was able to pull in current prescription insurance information, and plan requires a PA for Ozempic  2mg .  This has been submitted via CoverMyMeds (Key [id number]), and the request has been approved. -Patient's new insurance does not cover Libre 3, but they do cover Dexcom.  Sending orders for Dexcom G7 sensors to patients pharmacy. -Coordinating with Cpht to contact pharmacy to provide patient's new insurance information and check copays for Ozempic  and Dexcom Sensors Follow Up Plan: Will send MyChart message to patient to follow-up on refill status of Ozempic  and CGM sensors and will schedule 3 month telephone follow-up visit(copy/paste from last note)   Medication Adherence Barriers Identified:   Access issues with any new medication or testing device: Yes Ozempic  and Dexcom   Medication Adherence Barriers Addressed/Actions Taken:  Reviewed medication changes per plan from last clinical pharmacist note Medication Access for Ozempic  and Dexcom Will discuss medication access concerns with pharmacist Contacted pharmacy regarding new prescriptions Spoke to staff at [facility] pharmacy. Informed staff based on PharmD note that patient no longer had Medicaid but did have insurance thru employer and could provide that information and a coupon card for Ozempic  if price is too high. Per pharmacy staff member, they have new insurance on file. Per pharmacy staff member, the 1 month cost of Ozempic  and the 1 month cost of Dexcom sensors are $0.00 for the patietn. Per pharmacy  staff member, both Ozempic  and Dexcom would have to be ordered and would be in on Friday.   Next clinical pharmacist appointment is scheduled for: TBD  Kate Caddy, CPhT [facility] Office: [phone] Email: [email or web address]   "

## 2024-10-25 ENCOUNTER — Other Ambulatory Visit

## 2024-11-29 ENCOUNTER — Ambulatory Visit: Admitting: Urgent Care
